# Patient Record
Sex: Female | Born: 2000 | Hispanic: Yes | Marital: Single | Smoking: Never smoker
Health system: Southern US, Community
[De-identification: ages and names within clinical notes are randomized; demographics above are authoritative.]

## PROBLEM LIST (undated history)

## (undated) DIAGNOSIS — K2970 Gastritis, unspecified, without bleeding: Secondary | ICD-10-CM

---
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
## 2022-01-12 ENCOUNTER — Emergency Department (HOSPITAL_COMMUNITY): Payer: Medicaid Other

## 2022-01-12 ENCOUNTER — Other Ambulatory Visit: Payer: Self-pay

## 2022-01-12 ENCOUNTER — Encounter (HOSPITAL_COMMUNITY): Payer: Self-pay | Admitting: Emergency Medicine

## 2022-01-12 ENCOUNTER — Emergency Department (HOSPITAL_COMMUNITY)
Admission: EM | Admit: 2022-01-12 | Discharge: 2022-01-12 | Disposition: A | Discharge status: Home / Self Care | Payer: Medicaid Other | Attending: Emergency Medicine | Admitting: Emergency Medicine

## 2022-01-12 DIAGNOSIS — R1013 Epigastric pain: Secondary | ICD-10-CM | POA: Insufficient documentation

## 2022-01-12 DIAGNOSIS — R197 Diarrhea, unspecified: Secondary | ICD-10-CM | POA: Insufficient documentation

## 2022-01-12 DIAGNOSIS — R6883 Chills (without fever): Secondary | ICD-10-CM | POA: Diagnosis not present

## 2022-01-12 LAB — COMPREHENSIVE METABOLIC PANEL
ALT: 19 U/L (ref 0–44)
AST: 16 U/L (ref 15–41)
Albumin: 3.8 g/dL (ref 3.5–5.0)
Alkaline Phosphatase: 86 U/L (ref 38–126)
Anion gap: 9 (ref 5–15)
BUN: 10 mg/dL (ref 6–20)
CO2: 24 mmol/L (ref 22–32)
Calcium: 9.2 mg/dL (ref 8.9–10.3)
Chloride: 101 mmol/L (ref 98–111)
Creatinine, Ser: 0.57 mg/dL (ref 0.44–1.00)
GFR, Estimated: >60 mL/min (ref >60)
Glucose, Bld: 118 mg/dL — ABNORMAL HIGH (ref 70–99)
Potassium: 3.6 mmol/L (ref 3.5–5.1)
Sodium: 134 mmol/L — ABNORMAL LOW (ref 135–145)
Total Bilirubin: 0.3 mg/dL (ref 0.3–1.2)
Total Protein: 7.8 g/dL (ref 6.5–8.1)

## 2022-01-12 LAB — CBC
HCT: 42.7 % (ref 36.0–46.0)
Hemoglobin: 14.4 g/dL (ref 12.0–15.0)
MCH: 30.4 pg (ref 26.0–34.0)
MCHC: 33.7 g/dL (ref 30.0–36.0)
MCV: 90.3 fL (ref 80.0–100.0)
Platelets: 302 10*3/uL (ref 150–400)
RBC: 4.73 MIL/uL (ref 3.87–5.11)
RDW: 11.6 % (ref 11.5–15.5)
WBC: 11.1 10*3/uL — ABNORMAL HIGH (ref 4.0–10.5)
nRBC: 0 % (ref 0.0–0.2)

## 2022-01-12 LAB — URINALYSIS, ROUTINE W REFLEX MICROSCOPIC
Bacteria, UA: NONE SEEN
Bilirubin Urine: NEGATIVE
Glucose, UA: NEGATIVE mg/dL
Ketones, ur: NEGATIVE mg/dL
Nitrite: NEGATIVE
Protein, ur: NEGATIVE mg/dL
Specific Gravity, Urine: 1.026 (ref 1.005–1.030)
pH: 5 (ref 5.0–8.0)

## 2022-01-12 LAB — LIPASE, BLOOD: Lipase: 23 U/L (ref 11–51)

## 2022-01-12 LAB — I-STAT BETA HCG BLOOD, ED (MC, WL, AP ONLY): I-stat hCG, quantitative: <5 m[IU]/mL (ref <5)

## 2022-01-12 MED ORDER — SUCRALFATE 1 G PO TABS: 1.0000 g | ORAL_TABLET | Freq: Three times a day (TID) | ORAL | 0 refills | Status: DC

## 2022-01-12 MED ORDER — LIDOCAINE VISCOUS HCL 2 % MT SOLN
15.0000 mL | Freq: Once | OROMUCOSAL | Status: AC
  Administered 2022-01-12: 15 mL via ORAL
  Filled 2022-01-12: qty 15

## 2022-01-12 MED ORDER — PANTOPRAZOLE SODIUM 20 MG PO TBEC: 20.0000 mg | DELAYED_RELEASE_TABLET | Freq: Every day | ORAL | 0 refills | Status: DC

## 2022-01-12 MED ORDER — ONDANSETRON HCL 4 MG/2ML IJ SOLN
4.0000 mg | Freq: Once | INTRAMUSCULAR | Status: AC
  Administered 2022-01-12: 4 mg via INTRAVENOUS
  Filled 2022-01-12: qty 2

## 2022-01-12 MED ORDER — MORPHINE SULFATE (PF) 4 MG/ML IV SOLN
4.0000 mg | Freq: Once | INTRAVENOUS | Status: AC
  Administered 2022-01-12: 4 mg via INTRAVENOUS
  Filled 2022-01-12: qty 1

## 2022-01-12 MED ORDER — ALUM & MAG HYDROXIDE-SIMETH 200-200-20 MG/5ML PO SUSP
30.0000 mL | Freq: Once | ORAL | Status: AC
  Administered 2022-01-12: 30 mL via ORAL
  Filled 2022-01-12: qty 30

## 2022-01-12 NOTE — Discharge Instructions (Signed)
Your abdominal pain is likely due to inflammations of your stomach.  Please take Protonix and Carafate as prescribed.  Follow-up with your primary care provider for further care.  Return if your symptoms worsen or if you have any concern.

## 2022-01-12 NOTE — ED Notes (Signed)
Patient verbalizes understanding of d/c instructions. Opportunities for questions and answers were provided. Pt d/c from ED and ambulated to lobby.  

## 2022-01-12 NOTE — ED Triage Notes (Signed)
C/o upper abd pain since Wednesday with nausea.  Denies vomiting and diarrhea.  States she has abnormal menstrual cycles and having vaginal bleeding x 1 month.

## 2022-01-12 NOTE — ED Provider Notes (Signed)
[facility] EMERGENCY DEPARTMENT Provider Note   CSN: [id number] Arrival date & time: 01/12/22  1825     History  Chief Complaint  Patient presents with   Abdominal Pain    Lindsay Yates is a 21 y.o. female.  The history is provided by the patient and medical records. No language interpreter was used.  Abdominal Pain  21 year old female presenting for evaluation of abdominal pain.  Patient report for the past 3 to 4 days she has had upper abdominal pain.  She described pain as a burning sensation to her mid upper abdomen, worsening with movement and with eating.  Pain is moderate and episodic.  She also endorsed feeling nauseous, and has vomited bilious contents.  She endorses having some loose stools as well and some chills.  She denies fever chest pain shortness of breath productive cough.  She denies any dysuria.  She has an intact gallbladder.  Patient also endorsed having abnormal menstruation and states she has been having intermittent vaginal bleeding for the past month.  Home Medications Prior to Admission medications   Medication Sig Start Date End Date Taking? Authorizing Provider  naproxen (NAPROSYN) 500 MG tablet Take 1 tablet (500 mg total) by mouth 2 (two) times daily. 11/07/14   Antony Madura, PA-C      Allergies    Patient has no known allergies.    Review of Systems   Review of Systems  Gastrointestinal:  Positive for abdominal pain.  All other systems reviewed and are negative.  Physical Exam Updated Vital Signs BP (!) 160/109    Pulse 76    Temp 97.7 F (36.5 C)    Resp 15    SpO2 100%  Physical Exam Vitals and nursing note reviewed.  Constitutional:      General: She is not in acute distress.    Appearance: She is well-developed. She is obese.  HENT:     Head: Atraumatic.  Eyes:     Conjunctiva/sclera: Conjunctivae normal.  Cardiovascular:     Rate and Rhythm: Normal rate and regular rhythm.  Pulmonary:     Effort:  Pulmonary effort is normal.     Breath sounds: No wheezing, rhonchi or rales.  Abdominal:     Palpations: Abdomen is soft.     Tenderness: There is abdominal tenderness in the right upper quadrant, epigastric area and periumbilical area.     Hernia: No hernia is present.  Musculoskeletal:     Cervical back: Neck supple.  Skin:    Findings: No rash.  Neurological:     Mental Status: She is alert.  Psychiatric:        Mood and Affect: Mood normal.    ED Results / Procedures / Treatments   Labs (all labs ordered are listed, but only abnormal results are displayed) Labs Reviewed  COMPREHENSIVE METABOLIC PANEL - Abnormal; Notable for the following components:      Result Value   Sodium 134 (*)    Glucose, Bld 118 (*)    All other components within normal limits  CBC - Abnormal; Notable for the following components:   WBC 11.1 (*)    All other components within normal limits  URINALYSIS, ROUTINE W REFLEX MICROSCOPIC - Abnormal; Notable for the following components:   APPearance CLOUDY (*)    Hgb urine dipstick MODERATE (*)    Leukocytes,Ua TRACE (*)    All other components within normal limits  LIPASE, BLOOD  I-STAT BETA HCG BLOOD, ED ([facility], [facility], [facility]  ONLY)    EKG None  Radiology US Abdomen Limited  Result Date: 01/12/2022 CLINICAL DATA:  RUQ pain EXAM: ULTRASOUND ABDOMEN LIMITED RIGHT UPPER QUADRANT COMPARISON:  None. FINDINGS: Gallbladder: No gallstones or wall thickening visualized. No sonographic Murphy sign noted by sonographer. Common bile duct: Diameter: 6 mm. Liver: Focal fatty sparing along the gallbladder fossa (image 6 of 30). No definite focal lesion identified. Increased parenchymal echogenicity. Portal vein is patent on color Doppler imaging with normal direction of blood flow towards the liver. Other: None. IMPRESSION: Hepatic steatosis. Please note limited evaluation for focal hepatic masses in a patient with hepatic steatosis due to decreased penetration of the  acoustic ultrasound waves. Electronically Signed   By: Tish Frederickson M.D.   On: 01/12/2022 20:06    Procedures Procedures    Medications Ordered in ED Medications  morphine (PF) 4 MG/ML injection 4 mg (4 mg Intravenous Given 01/12/22 1929)  ondansetron (ZOFRAN) injection 4 mg (4 mg Intravenous Given 01/12/22 1929)  alum & mag hydroxide-simeth (MAALOX/MYLANTA) 200-200-20 MG/5ML suspension 30 mL (30 mLs Oral Given 01/12/22 2029)    And  lidocaine (XYLOCAINE) 2 % viscous mouth solution 15 mL (15 mLs Oral Given 01/12/22 2029)    ED Course/ Medical Decision Making/ A&P                           Medical Decision Making Amount and/or Complexity of Data Reviewed Labs: ordered.   BP (!) 160/109    Pulse 76    Temp 97.7 F (36.5 C)    Resp 15    SpO2 100%   7:19 PM This is a 21 year old obese female here with upper abdominal discomfort for the past few days.  She also endorsed nausea and vomiting of bilious content.  On exam she does have tenderness to her epigastric and right upper quadrant region.  She would benefit from a limited abdominal ultrasound to rule out biliary disease.  No pain at McBurney's point, low suspicion for appendicitis.  No chest pain shortness of breath no productive cough to suggest cardiopulmonary disease.  8:54 PM Labs and imaging was independently reviewed and interpreted by me.  Pregnancy test is negative, normal lipase, normal electrolyte panels,Mild elevated white count of 11.1, UA shows cloudy urine and moderate hemoglobin to 6 but no evidence of UTI.  Patient is currently on her menstruation therefore I suspect the blood is not from the bladder or kidney.  Limited abdominal ultrasound obtain without evidence of gallstone or wall thickening of the gallbladder.  Evidence of hepatic steatosis noted.  After receiving GI cocktail, patient reports she is feeling better.  When considering her work-up, at this time I suspect her discomfort is likely gastritis/reflux and  less likely to be other acute abdominal pathology.  Will discharge home with PPI, and encourage patient to follow-up with PCP for further care.  This patient presents to the ED for concern of abd pain, this involves an extensive number of treatment options, and is a complaint that carries with it a high risk of complications and morbidity.  The differential diagnosis includes cholecystitis, pancreatitis, gastritis, PUD, viral GI  Co morbidities that complicate the patient evaluation obesity Additional history obtained:  Additional history obtained from none External records from outside source obtained and reviewed including prior ER notes  Lab Tests:  I Ordered, and personally interpreted labs.  The pertinent results include:  as above  Imaging Studies ordered:  I ordered imaging  studies including limited abdominal US I independently visualized and interpreted imaging which showed no acute finding I agree with the radiologist interpretation  Cardiac Monitoring:  The patient was maintained on a cardiac monitor.  I personally viewed and interpreted the cardiac monitored which showed an underlying rhythm of: sinus rhythm  Medicines ordered and prescription drug management:  I ordered medication including GI cocktail  for abd pain Reevaluation of the patient after these medicines showed that the patient improved I have reviewed the patients home medicines and have made adjustments as needed  Test Considered: as above  Critical Interventions: as above   Problem List / ED Course: epigastric pain  Reevaluation:  After the interventions noted above, I reevaluated the patient and found that they have :improved  Social Determinants of Health: lack of follow up  No PCP  Dispostion:  After consideration of the diagnostic results and the patients response to treatment, I feel that the patent would benefit from outpt f/u.         Final Clinical Impression(s) / ED  Diagnoses Final diagnoses:  Epigastric pain    Rx / DC Orders ED Discharge Orders          Ordered    pantoprazole (PROTONIX) 20 MG tablet  Daily        01/12/22 2059    sucralfate (CARAFATE) 1 g tablet  3 times daily with meals & bedtime        01/12/22 2059              Fayrene Helper, PA-C 01/12/22 2106    Gwyneth Sprout, MD 01/12/22 2317

## 2022-01-17 ENCOUNTER — Emergency Department (HOSPITAL_COMMUNITY)
Admission: EM | Admit: 2022-01-17 | Discharge: 2022-01-18 | Disposition: A | Discharge status: Home / Self Care | Payer: Medicaid Other | Attending: Emergency Medicine | Admitting: Emergency Medicine

## 2022-01-17 ENCOUNTER — Encounter (HOSPITAL_COMMUNITY): Payer: Self-pay

## 2022-01-17 DIAGNOSIS — R300 Dysuria: Secondary | ICD-10-CM | POA: Diagnosis not present

## 2022-01-17 DIAGNOSIS — R1013 Epigastric pain: Secondary | ICD-10-CM | POA: Insufficient documentation

## 2022-01-17 DIAGNOSIS — R112 Nausea with vomiting, unspecified: Secondary | ICD-10-CM | POA: Diagnosis not present

## 2022-01-17 DIAGNOSIS — Z5321 Procedure and treatment not carried out due to patient leaving prior to being seen by health care provider: Secondary | ICD-10-CM | POA: Insufficient documentation

## 2022-01-17 LAB — COMPREHENSIVE METABOLIC PANEL
ALT: 18 U/L (ref 0–44)
AST: 16 U/L (ref 15–41)
Albumin: 4 g/dL (ref 3.5–5.0)
Alkaline Phosphatase: 94 U/L (ref 38–126)
Anion gap: 10 (ref 5–15)
BUN: 9 mg/dL (ref 6–20)
CO2: 23 mmol/L (ref 22–32)
Calcium: 9.3 mg/dL (ref 8.9–10.3)
Chloride: 101 mmol/L (ref 98–111)
Creatinine, Ser: 0.55 mg/dL (ref 0.44–1.00)
GFR, Estimated: >60 mL/min (ref >60)
Glucose, Bld: 110 mg/dL — ABNORMAL HIGH (ref 70–99)
Potassium: 3.7 mmol/L (ref 3.5–5.1)
Sodium: 134 mmol/L — ABNORMAL LOW (ref 135–145)
Total Bilirubin: 0.3 mg/dL (ref 0.3–1.2)
Total Protein: 8.1 g/dL (ref 6.5–8.1)

## 2022-01-17 LAB — CBC
HCT: 42.8 % (ref 36.0–46.0)
Hemoglobin: 14.4 g/dL (ref 12.0–15.0)
MCH: 30.2 pg (ref 26.0–34.0)
MCHC: 33.6 g/dL (ref 30.0–36.0)
MCV: 89.7 fL (ref 80.0–100.0)
Platelets: 313 10*3/uL (ref 150–400)
RBC: 4.77 MIL/uL (ref 3.87–5.11)
RDW: 11.7 % (ref 11.5–15.5)
WBC: 12.5 10*3/uL — ABNORMAL HIGH (ref 4.0–10.5)
nRBC: 0 % (ref 0.0–0.2)

## 2022-01-17 LAB — URINALYSIS, ROUTINE W REFLEX MICROSCOPIC
Bilirubin Urine: NEGATIVE
Glucose, UA: NEGATIVE mg/dL
Ketones, ur: NEGATIVE mg/dL
Nitrite: NEGATIVE
Protein, ur: 30 mg/dL — AB
Specific Gravity, Urine: 1.027 (ref 1.005–1.030)
pH: 5 (ref 5.0–8.0)

## 2022-01-17 LAB — I-STAT BETA HCG BLOOD, ED (MC, WL, AP ONLY): I-stat hCG, quantitative: <5 m[IU]/mL (ref <5)

## 2022-01-17 LAB — LIPASE, BLOOD: Lipase: 24 U/L (ref 11–51)

## 2022-01-17 NOTE — ED Triage Notes (Signed)
Pt arrives POV for eval of epigastric pain w/ radiation to LUQ. Pt reports eating/drinking makes it worse. Seen here 2/12 for same and had neg RUQ abd U/S at that time. Reports ongoing sx since that time

## 2022-01-17 NOTE — ED Provider Triage Note (Signed)
Emergency Medicine Provider Triage Evaluation Note  Lindsay Yates , a 21 y.o. female  was evaluated in triage.  Pt complains of epigastric abdominal pain. States that same has been ongoing since 2/09. Was seen here on 2/12 and had RUQ [place] which was unremarkable for acute findings. States that her pain is worse with eating and that she is unable to eat due to nausea and vomiting and severe pain. States that pain is worse today which is what brought her in today. Also states that it has been several days since her last bowel movement. No hx of abdominal surgeries. Has appt with GI next Friday but does not feel that she can wait due to pain.  Review of Systems  Positive:  Negative: See above  Physical Exam  BP (!) 138/94 (BP Location: Right Arm)    Pulse 77    Temp 98.8 F (37.1 C) (Oral)    Resp 16    Ht 5\' 3"  (1.6 m)    Wt 100 kg    SpO2 99%    BMI 39.05 kg/m  Gen:   Awake, no distress   Resp:  Normal effort  MSK:   Moves extremities without difficulty  Other:  Diffuse abdominal tenderness  Medical Decision Making  Medically screening exam initiated at 8:22 PM.  Appropriate orders placed.  Lindsay Yates was informed that the remainder of the evaluation will be completed by another provider, this initial triage assessment does not replace that evaluation, and the importance of remaining in the ED until their evaluation is complete.     Yong Channel, PA-C 01/17/22 2025

## 2022-01-18 ENCOUNTER — Emergency Department (HOSPITAL_COMMUNITY): Payer: Medicaid Other

## 2022-01-18 ENCOUNTER — Emergency Department (HOSPITAL_COMMUNITY)
Admission: EM | Admit: 2022-01-18 | Discharge: 2022-01-18 | Disposition: A | Discharge status: Home / Self Care | Payer: Medicaid Other | Source: Home / Self Care | Attending: Emergency Medicine | Admitting: Emergency Medicine

## 2022-01-18 ENCOUNTER — Other Ambulatory Visit: Payer: Self-pay

## 2022-01-18 ENCOUNTER — Encounter (HOSPITAL_COMMUNITY): Payer: Self-pay | Admitting: Emergency Medicine

## 2022-01-18 DIAGNOSIS — R1013 Epigastric pain: Secondary | ICD-10-CM | POA: Insufficient documentation

## 2022-01-18 DIAGNOSIS — R300 Dysuria: Secondary | ICD-10-CM | POA: Insufficient documentation

## 2022-01-18 DIAGNOSIS — R110 Nausea: Secondary | ICD-10-CM | POA: Insufficient documentation

## 2022-01-18 DIAGNOSIS — R109 Unspecified abdominal pain: Secondary | ICD-10-CM

## 2022-01-18 MED ORDER — POLYETHYLENE GLYCOL 3350 17 G PO PACK: 17.0000 g | PACK | Freq: Every day | ORAL | 0 refills | Status: DC

## 2022-01-18 MED ORDER — DICYCLOMINE HCL 20 MG PO TABS: 20.0000 mg | ORAL_TABLET | Freq: Two times a day (BID) | ORAL | 0 refills | Status: DC

## 2022-01-18 MED ORDER — IOHEXOL 300 MG/ML  SOLN
100.0000 mL | Freq: Once | INTRAMUSCULAR | Status: AC | PRN
  Administered 2022-01-18: 100 mL via INTRAVENOUS

## 2022-01-18 MED ORDER — ONDANSETRON 4 MG PO TBDP: 4.0000 mg | ORAL_TABLET | Freq: Three times a day (TID) | ORAL | 0 refills | Status: DC | PRN

## 2022-01-18 MED ORDER — CEPHALEXIN 500 MG PO CAPS: 500.0000 mg | ORAL_CAPSULE | Freq: Three times a day (TID) | ORAL | 0 refills | Status: AC

## 2022-01-18 MED ORDER — SODIUM CHLORIDE 0.9 % IV BOLUS
1000.0000 mL | Freq: Once | INTRAVENOUS | Status: AC
  Administered 2022-01-18: 1000 mL via INTRAVENOUS

## 2022-01-18 MED ORDER — SUCRALFATE 1 G PO TABS: 1.0000 g | ORAL_TABLET | Freq: Three times a day (TID) | ORAL | 0 refills | Status: DC

## 2022-01-18 MED ORDER — SENNA 8.6 MG PO TABS: 1.0000 | ORAL_TABLET | Freq: Two times a day (BID) | ORAL | 0 refills | Status: DC | PRN

## 2022-01-18 NOTE — ED Triage Notes (Signed)
Pt reports upper abd pain since Wednesday that is worse after eating and drinking water.  States she came to ED last night and had labs and CT and that she has been sitting in lobby since then and didn't hear her name called.  Denies any pain at present.  Reports nausea, vomiting, and constipation.  Saline lock in place from previous visit.

## 2022-01-18 NOTE — ED Provider Notes (Signed)
[facility] EMERGENC[facility] DEPARTMENT Provider Note   CSN: [id number] Arrival date & time: 01/18/22  0831     History  Chief Complaint  Patient presents with   Abdominal Pain    Lindsay Yates is a 21 y.o. female.  21 year old female without significant past medical history presents today for intermittent epigastric abdominal pain of 1 week duration.  Patient was evaluated here 2/12 and was given Protonix and Carafate with improvement in symptoms.  Patient states her pain returned on Wednesday.  Patient states her pain primarily comes on after eating and she describes it as burning and stabbing pain.  She denies fever, chills, vomiting but does endorse nausea, radiation of pain.  She does endorse dysuria over the past couple days, she attributes this to lack of hydration.  She does have GI appointment on Friday.  She states the medication she was given last week do help with her symptoms.  She reports last bowel movement was Thursday.  This is not typical for her.  She has not tried any over-the-counter laxatives.  The history is provided by the patient. No language interpreter was used.      Home Medications Prior to Admission medications   Medication Sig Start Date End Date Taking? Authorizing Provider  naproxen (NAPROSYN) 500 MG tablet Take 1 tablet (500 mg total) by mouth 2 (two) times daily. 11/07/14   Antonietta Breach, PA-C  pantoprazole (PROTONIX) 20 MG tablet Take 1 tablet (20 mg total) by mouth daily. 01/12/22   Domenic Moras, PA-C  sucralfate (CARAFATE) 1 g tablet Take 1 tablet (1 g total) by mouth 4 (four) times daily -  with meals and at bedtime. 01/12/22   Domenic Moras, PA-C      Allergies    Patient has no known allergies.    Review of Systems   Review of Systems  Constitutional:  Negative for activity change, chills and fever.  Respiratory:  Negative for shortness of breath.   Gastrointestinal:  Positive for abdominal pain, constipation and nausea.  Negative for blood in stool and vomiting.  Genitourinary:  Positive for dysuria. Negative for flank pain.  Musculoskeletal:  Negative for back pain.  All other systems reviewed and are negative.  Physical Exam Updated Vital Signs BP (!) 140/100 (BP Location: Right Arm)    Pulse (!) 101    Temp 98.4 F (36.9 C) (Oral)    Resp 16    SpO2 99%  Physical Exam Vitals and nursing note reviewed.  Constitutional:      General: She is not in acute distress.    Appearance: Normal appearance. She is not ill-appearing.  HENT:     Head: Normocephalic and atraumatic.     Nose: Nose normal.  Eyes:     General: No scleral icterus.    Extraocular Movements: Extraocular movements intact.     Conjunctiva/sclera: Conjunctivae normal.  Cardiovascular:     Rate and Rhythm: Normal rate and regular rhythm.     Pulses: Normal pulses.     Heart sounds: Normal heart sounds.  Pulmonary:     Effort: Pulmonary effort is normal. No respiratory distress.     Breath sounds: Normal breath sounds. No wheezing or rales.  Abdominal:     General: There is no distension.     Palpations: Abdomen is soft.     Tenderness: There is no abdominal tenderness. There is no right CVA tenderness, left CVA tenderness or guarding.  Musculoskeletal:        General:  Normal range of motion.     Cervical back: Normal range of motion.  Skin:    General: Skin is warm and dry.  Neurological:     General: No focal deficit present.     Mental Status: She is alert. Mental status is at baseline.    ED Results / Procedures / Treatments   Labs (all labs ordered are listed, but only abnormal results are displayed) Labs Reviewed - No data to display  EKG None  Radiology CT ABDOMEN PELVIS W CONTRAST  Result Date: 01/18/2022 CLINICAL DATA:  Abdominal pain, significantly worse with eating or drinking. EXAM: CT ABDOMEN AND PELVIS WITH CONTRAST TECHNIQUE: Multidetector CT imaging of the abdomen and pelvis was performed using the  standard protocol following bolus administration of intravenous contrast. RADIATION DOSE REDUCTION: This exam was performed according to the departmental dose-optimization program which includes automated exposure control, adjustment of the mA and/or kV according to patient size and/or use of iterative reconstruction technique. CONTRAST:  139mL OMNIPAQUE IOHEXOL 300 MG/ML  SOLN COMPARISON:  None. FINDINGS: Lower chest: No acute abnormality. Hepatobiliary: No focal liver abnormality is seen. No gallstones, gallbladder wall thickening, or biliary dilatation. Pancreas: Unremarkable. No pancreatic ductal dilatation or surrounding inflammatory changes. Spleen: Normal in size without focal abnormality. Adrenals/Urinary Tract: Normal adrenal glands. No kidney mass or hydronephrosis identified. Urinary bladder appears normal. Stomach/Bowel: Stomach appears nondistended. The appendix is visualized and is within normal limits. No bowel wall thickening, inflammation, or distension. Vascular/Lymphatic: Normal abdominal aorta. No abdominopelvic adenopathy identified. Reproductive: Uterus and adnexal structures appear within normal limits. Other: No free fluid or fluid collections. Musculoskeletal: No acute or significant osseous findings. IMPRESSION: No acute findings within the abdomen or pelvis. Electronically Signed   By: Kerby Moors M.D.   On: 01/18/2022 06:12    Procedures Procedures    Medications Ordered in ED Medications  sodium chloride 0.9 % bolus 1,000 mL (1,000 mLs Intravenous New Bag/Given 01/18/22 [id number])    ED Course/ Medical Decision Making/ A&P                           Medical Decision Making  Medical Decision Making / ED Course   This patient presents to the ED for concern of abdominal pain, nausea, this involves an extensive number of treatment options, and is a complaint that carries with it a high risk of complications and morbidity.  The differential diagnosis includes UTI, appendicitis,  cholecystitis, pancreatitis, gastroenteritis, mesenteric ischemia  MDM: 21 year old female presents today for evaluation of abdominal pain, nausea of about 1 week duration.  Pain is intermittent and primarily postprandial.  Currently she is asymptomatic.  Abdomen is nondistended and nontender.  GI follow-up scheduled for Friday.  Without significant past medical history.  Patient presented last night and states she has been here since then however her name was not called.  She did have blood work as well as CT imaging done under that visit.  CBC with mild leukocytosis which is likely reactive given she is without fever.  Alternatively this could be associated with potential UTI.  UA shows some leukocytes however nitrite negative.  We will treat for UTI and send urine culture.  CMP with sodium of 134, glucose of 110 otherwise unremarkable.  Lipase within normal limits.  Unlikely mesenteric ischemia given no risk factors, during the episodes of pain her pain is localized as opposed to being diffuse which would be seen in mesenteric ischemia.  Description not  consistent with mesenteric ischemia.  Mild leukocytosis.  Would expect higher leukocytosis and mesenteric ischemia.  Nontoxic-appearing.  Doubt appendicitis or cholecystitis given location of pain and negative CT abdomen pelvis.  Nature of pain not consistent with pericarditis.  Will provide IV hydration and reassess.  Patient remains without symptoms despite p.o. intake in the emergency room.  Patient is appropriate for discharge.  Bowel regimen discussed.  Will provide Bentyl and refill on Carafate.  Will provide Zofran.  Discussed importance of follow-up with gastroenterology.  We will treat potential UTI and send for urine culture.   Additional history obtained: -Additional history obtained from recent emergency room visit on 2/12 confirming patient was given Protonix and Carafate. -External records from outside source obtained and reviewed  including: Chart review including previous notes, labs, imaging, consultation notes   Lab Tests: -I ordered, reviewed, and interpreted labs.   The pertinent results include:   Labs Reviewed - No data to display    EKG  EKG Interpretation  Date/Time:    Ventricular Rate:    PR Interval:    QRS Duration:   QT Interval:    QTC Calculation:   R Axis:     Text Interpretation:           Imaging Studies ordered: I ordered imaging studies including CT abdomen pelvis without acute intra-abdominal process I independently visualized and interpreted imaging. I agree with the radiologist interpretation   Medicines ordered and prescription drug management: Meds ordered this encounter  Medications   sodium chloride 0.9 % bolus 1,000 mL    -I have reviewed the patients home medicines and have made adjustments as needed  Reevaluation: After the interventions noted above, I reevaluated the patient and found that they have :improved Initially noted to be tachycardic however rate improved prior to discharge following hydration.  Co morbidities that complicate the patient evaluation History reviewed. No pertinent past medical history.    Dispostion: Patient is appropriate for discharge.  Discharged in stable condition.  Return precautions discussed.  Patient voices understanding and is in agreement with plan.   Final Clinical Impression(s) / ED Diagnoses Final diagnoses:  Abdominal pain, unspecified abdominal location    Rx / DC Orders ED Discharge Orders          Ordered    sucralfate (CARAFATE) 1 g tablet  3 times daily with meals & bedtime        01/18/22 0958    dicyclomine (BENTYL) 20 MG tablet  2 times daily        01/18/22 0958    cephALEXin (KEFLEX) 500 MG capsule  3 times daily        01/18/22 0958    polyethylene glycol (MIRALAX) 17 g packet  Daily        01/18/22 0958    senna (SENOKOT) 8.6 MG TABS tablet  2 times daily PRN        01/18/22 0958     ondansetron (ZOFRAN-ODT) 4 MG disintegrating tablet  Every 8 hours PRN        01/18/22 1036              Evlyn Courier, PA-C 01/18/22 1036    Davonna Belling, MD 01/18/22 1528

## 2022-01-18 NOTE — Discharge Instructions (Addendum)
Your work-up in the emergency room including blood work, CT scan were reassuring against a serious cause of your abdominal pain.  You are given some IV fluids for hydration.  Your UA showed potential urinary tract infection.  I have sent in antibiotics to the pharmacy for you.  We will also send in a urine culture to confirm if you have a urinary tract infection.  Also recommend you take MiraLAX and senna to regulate your bowels.  You can take up to 4 scoops of MiraLAX per day until you have a bowel movement and then you can start decreasing the dosage down to 1 scoop per day.  Please follow-up with your gastroenterologist as scheduled.  If your abdominal pain worsens, you develop fever, nausea vomiting to the point you are unable to keep any food or drink down please return to the emergency room.  I have sent and nausea medication, Bentyl, and refill of your Carafate.

## 2022-01-18 NOTE — ED Notes (Signed)
Pt not found during vitals rounding. Pt called for vitals x3 no answer.

## 2022-01-18 NOTE — ED Notes (Signed)
Graham crackers and water given for po challenge.

## 2022-01-18 NOTE — ED Provider Triage Note (Signed)
Emergency Medicine Provider Triage Evaluation Note  Lindsay Yates , a 21 y.o. female  was evaluated in triage.  Pt complains of abdominal pain.  Patient was seen in triage last night and states she never left the department. Nursing staff notes report patient was called for vital signs x 3 at 5:45am this morning and they were unable to find the patient. She was removed from the system at that time.   She states since she hasn't had anything to eat or drink in hours so her pain has gone away. She said it is only painful after eating or drinking.   Previous provider's triage MSE note: "Lindsay Yates , a 21 y.o. female  was evaluated in triage.  Pt complains of epigastric abdominal pain. States that same has been ongoing since 2/09. Was seen here on 2/12 and had RUQ [place] which was unremarkable for acute findings. States that her pain is worse with eating and that she is unable to eat due to nausea and vomiting and severe pain. States that pain is worse today which is what brought her in today. Also states that it has been several days since her last bowel movement. No hx of abdominal surgeries. Has appt with GI next Friday but does not feel that she can wait due to pain."  Review of Systems  As above  Physical Exam  BP (!) 140/100 (BP Location: Right Arm)    Pulse (!) 101    Temp 98.4 F (36.9 C) (Oral)    Resp 16    SpO2 99%  Gen:   Awake, no distress   Resp:  Normal effort  MSK:   Moves extremities without difficulty  Other:    Medical Decision Making  Medically screening exam initiated at 8:40 AM.  Appropriate orders placed.  Lindsay Yates was informed that the remainder of the evaluation will be completed by another provider, this initial triage assessment does not replace that evaluation, and the importance of remaining in the ED until their evaluation is complete.     Lindsay Yates T, PA-C 01/18/22 0840

## 2022-01-24 ENCOUNTER — Ambulatory Visit (INDEPENDENT_AMBULATORY_CARE_PROVIDER_SITE_OTHER): Payer: Medicaid Other | Admitting: Primary Care

## 2022-01-24 ENCOUNTER — Encounter (INDEPENDENT_AMBULATORY_CARE_PROVIDER_SITE_OTHER): Payer: Self-pay | Admitting: Primary Care

## 2022-01-24 ENCOUNTER — Other Ambulatory Visit: Payer: Self-pay

## 2022-01-24 VITALS — BP 115/79 | HR 96 | Temp 97.8°F | Ht 63.0 in | Wt 237.4 lb

## 2022-01-24 DIAGNOSIS — K219 Gastro-esophageal reflux disease without esophagitis: Secondary | ICD-10-CM

## 2022-01-24 DIAGNOSIS — Z7689 Persons encountering health services in other specified circumstances: Secondary | ICD-10-CM | POA: Diagnosis not present

## 2022-01-24 MED ORDER — SUCRALFATE 1 G PO TABS: 1.0000 g | ORAL_TABLET | Freq: Three times a day (TID) | ORAL | 1 refills | Status: DC

## 2022-01-24 NOTE — Patient Instructions (Signed)

## 2022-01-24 NOTE — Progress Notes (Signed)
Abdominal pain only when applying pressure or stretching

## 2022-01-24 NOTE — Progress Notes (Signed)
[facility]   Subjective:   [name] is a 21 y.o. female presents for ED  follow up and establish care. Patient presented to the ED on  01/18/22, she was experiencing n/v all day before her birthday. She was dx with abdominal pain, unspecified abdominal location. 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 She tx herself with OTC EXCEDRIN .  Triggers can be caused by drinking alcohol , caffeine , coffee and 3 energy drinks.  This condition may be triggered or caused by: Caffeine, aged cheese, and chocolate. Irregular cycle due to [facility]- Nexplanon  Lindsay Yates sister was given permission to be presen t at her appt.  History reviewed. No pertinent past medical history.   No Known Allergies    Current Outpatient Medications on File Prior to Visit  Medication Sig Dispense Refill   dicyclomine (BENTYL) 20 MG tablet Take 1 tablet (20 mg total) by mouth 2 (two) times daily. 20 tablet 0   ondansetron (ZOFRAN-ODT) 4 MG disintegrating tablet Take 1 tablet (4 mg total) by mouth every 8 (eight) hours as needed for nausea or vomiting. 20 tablet 0   pantoprazole (PROTONIX) 20 MG tablet Take 1 tablet (20 mg total) by mouth daily. 30 tablet 0   polyethylene glycol (MIRALAX) 17 g packet Take 17 g by mouth daily. 14 each 0   senna (SENOKOT) 8.6 MG TABS tablet Take 1 tablet (8.6 mg total) by mouth 2 (two) times daily as needed for mild constipation. 30 tablet 0   sucralfate (CARAFATE) 1 g tablet Take 1 tablet (1 g total) by mouth 4 (four) times daily -  with meals and at bedtime. 20 tablet 0   No current facility-administered medications on file prior to visit.     Review of System: Comprehensive ROS Pertinent positive and negative noted in HPI    Objective:  BP 115/79 (BP Location: Right Arm, Patient Position: Sitting, Cuff Size: Normal)    Pulse 96    Temp 97.8 F (36.6 C) (Oral)    Ht 5\' 3"  (1.6 m)    Wt 237 lb 6.4 oz (107.7 kg)    LMP  11/21/2021 (Approximate)    SpO2 96%    BMI 42.05 kg/m   Filed Weights   01/24/22 0940  Weight: 237 lb 6.4 oz (107.7 kg)    Physical Exam: General Appearance: Well nourished, in no apparent distress. Eyes: PERRLA, EOMs, conjunctiva no swelling or erythema Sinuses: No Frontal/maxillary tenderness ENT/Mouth: Ext aud canals clear, TMs without erythema, bulging.  Hearing normal.  Neck: Supple, thyroid normal.  Respiratory: Respiratory effort normal, BS equal bilaterally without rales, rhonchi, wheezing or stridor.  Cardio: RRR with no MRGs. Brisk peripheral pulses without edema.  Abdomen: Soft, + BS.  Non tender, no guarding, rebound, hernias, masses. Lymphatics: Non tender without lymphadenopathy.  Musculoskeletal: Full ROM, 5/5 strength, normal gait.  Skin: Warm, dry without rashes, lesions, ecchymosis.  Neuro: Cranial nerves intact. Normal muscle tone, no cerebellar symptoms. Sensation intact.  Psych: Awake and oriented X 3, normal affect, Insight and Judgment appropriate.    Assessment:  [name] was seen today for hospitalization follow-up.  Diagnoses and all orders for this visit:  Gastroesophageal reflux disease without esophagitis Discussed eating small frequent meal, reduction in acidic foods, fried foods ,spicy foods, alcohol caffeine and tobacco and certain medications. Avoid laying down after eating 41mins-1hour, elevated head of the bed.  -     sucralfate (CARAFATE) 1 g tablet;  Take 1 tablet (1 g total) by mouth 4 (four) times daily -  with meals and at bedtime.   Encounter to establish care Establish care with PCP  Meds ordered this encounter  Medications   sucralfate (CARAFATE) 1 g tablet    Sig: Take 1 tablet (1 g total) by mouth 4 (four) times daily -  with meals and at bedtime.    Dispense:  120 tablet    Refill:  1    This note has been created with Education officer, environmental. Any transcriptional errors are unintentional.    Grayce Sessions, NP 02/02/2022, 8:50 PM

## 2022-05-12 ENCOUNTER — Telehealth: Payer: Medicaid Other | Admitting: Physician Assistant

## 2022-05-12 DIAGNOSIS — Z8719 Personal history of other diseases of the digestive system: Secondary | ICD-10-CM

## 2022-05-12 DIAGNOSIS — R112 Nausea with vomiting, unspecified: Secondary | ICD-10-CM

## 2022-05-12 DIAGNOSIS — R197 Diarrhea, unspecified: Secondary | ICD-10-CM

## 2022-05-12 NOTE — Patient Instructions (Signed)
Lindsay Yates, thank you for joining Margaretann Loveless, PA-C for today's virtual visit.  While this provider is not your primary care provider (PCP), if your PCP is located in our provider database this encounter information will be shared with them immediately following your visit.  Consent: (Patient) Lindsay Yates provided verbal consent for this virtual visit at the beginning of the encounter.  Current Medications:  Current Outpatient Medications:    dicyclomine (BENTYL) 20 MG tablet, Take 1 tablet (20 mg total) by mouth 2 (two) times daily., Disp: 20 tablet, Rfl: 0   ondansetron (ZOFRAN-ODT) 4 MG disintegrating tablet, Take 1 tablet (4 mg total) by mouth every 8 (eight) hours as needed for nausea or vomiting., Disp: 20 tablet, Rfl: 0   pantoprazole (PROTONIX) 20 MG tablet, Take 1 tablet (20 mg total) by mouth daily., Disp: 30 tablet, Rfl: 0   polyethylene glycol (MIRALAX) 17 g packet, Take 17 g by mouth daily., Disp: 14 each, Rfl: 0   senna (SENOKOT) 8.6 MG TABS tablet, Take 1 tablet (8.6 mg total) by mouth 2 (two) times daily as needed for mild constipation., Disp: 30 tablet, Rfl: 0   sucralfate (CARAFATE) 1 g tablet, Take 1 tablet (1 g total) by mouth 4 (four) times daily -  with meals and at bedtime., Disp: 120 tablet, Rfl: 1   Medications ordered in this encounter:  No orders of the defined types were placed in this encounter.    *If you need refills on other medications prior to your next appointment, please contact your pharmacy*  Follow-Up: Call back or seek an in-person evaluation if the symptoms worsen or if the condition fails to improve as anticipated.  Other Instructions Based on what you shared with me, I feel your condition warrants further evaluation as soon as possible at an Emergency department.   If you are having a true medical emergency please call 911.      Emergency Department-[facility] ([facility])  Get Driving Directions   [phone]  [address]  [place], [place] [address]  Open 24/7/365      [facility] Emergency Department at [facility]  Get Driving Directions  [address]  [place], [place] [address]  Open 24/7/365    Emergency Department- [facility]  Get Driving Directions  [phone]  [address]  [place], [place] [address]  Open 24/7/365      [facility] at [facility]  Get Driving Directions  [phone]  [address]  [place], [place] [address]  Open 24/7/365    [facility]  Emergency Department- [facility]  Get Driving Directions  [phone]  [address]  [place], [place] [address]  Open 24/7/365    [place]  Emergency Department- [facility] [place]  Get Driving Directions  [address]  [place], [place] [address]  Open 24/7/365    [facility]  Emergency Department- [facility]  Get Driving Directions  [phone]  [address]  [place], [place] [address]  Open 24/7/365      If you have been instructed to have an in-person evaluation today at a local Urgent Care facility, please use the link below. It will take you to a list of all of our available [facility], including address, phone number and hours of operation. Please do not delay care.   Urgent Cares  If you or a family member do not have a  primary care provider, use the link below to schedule a visit and establish care. When you choose a [facility] primary care physician or advanced practice provider, you gain a long-term partner in health. Find a Primary Care Provider  Learn more about [facility] in-office and virtual care options: [facility] - Get Care Now

## 2022-05-12 NOTE — Progress Notes (Signed)
Virtual Visit Consent   [name], you are scheduled for a virtual visit with a [facility] provider today. Just as with appointments in the office, your consent must be obtained to participate. Your consent will be active for this visit and any virtual visit you may have with one of our providers in the next 365 days. If you have a MyChart account, a copy of this consent can be sent to you electronically.  As this is a virtual visit, video technology does not allow for your provider to perform a traditional examination. This may limit your provider's ability to fully assess your condition. If your provider identifies any concerns that need to be evaluated in person or the need to arrange testing (such as labs, EKG, etc.), we will make arrangements to do so. Although advances in technology are sophisticated, we cannot ensure that it will always work on either your end or our end. If the connection with a video visit is poor, the visit may have to be switched to a telephone visit. With either a video or telephone visit, we are not always able to ensure that we have a secure connection.  By engaging in this virtual visit, you consent to the provision of healthcare and authorize for your insurance to be billed (if applicable) for the services provided during this visit. Depending on your insurance coverage, you may receive a charge related to this service.  I need to obtain your verbal consent now. Are you willing to proceed with your visit today? Lindsay Yates has provided verbal consent on 05/12/2022 for a virtual visit (video or telephone). Margaretann Loveless, PA-C  Date: 05/12/2022 2:14 PM  Virtual Visit via Video Note   I, Margaretann Loveless, connected with  Lindsay Yates  ([id number], 10-May-2001) on 05/12/22 at  2:00 PM EDT by a video-enabled telemedicine application and verified that I am speaking with the correct person using two identifiers.  Location: Patient:  Virtual Visit Location Patient: Home Provider: Virtual Visit Location Provider: Home Office   I discussed the limitations of evaluation and management by telemedicine and the availability of in person appointments. The patient expressed understanding and agreed to proceed.    History of Present Illness: Lindsay Yates is a 21 y.o. who identifies as a female who was assigned female at birth, and is being seen today for nausea, vomiting, and diarrhea.  HPI: Emesis  This is a new problem. The current episode started yesterday. The problem occurs 5 to 10 times per day (with any intake, including fluids). The problem has been gradually worsening. The emesis has an appearance of bile. There has been no fever. Associated symptoms include abdominal pain, chills, diarrhea (constant diarrhea), headaches and myalgias. Pertinent negatives include no fever or URI. She has tried bed rest and increased fluids for the symptoms. The treatment provided no relief.      Problems: There are no problems to display for this patient.   Allergies: No Known Allergies Medications:  Current Outpatient Medications:    dicyclomine (BENTYL) 20 MG tablet, Take 1 tablet (20 mg total) by mouth 2 (two) times daily., Disp: 20 tablet, Rfl: 0   ondansetron (ZOFRAN-ODT) 4 MG disintegrating tablet, Take 1 tablet (4 mg total) by mouth every 8 (eight) hours as needed for nausea or vomiting., Disp: 20 tablet, Rfl: 0   pantoprazole (PROTONIX) 20 MG tablet, Take 1 tablet (20 mg total) by mouth daily., Disp: 30 tablet, Rfl: 0   polyethylene glycol (MIRALAX) 17 g packet,  Take 17 g by mouth daily., Disp: 14 each, Rfl: 0   senna (SENOKOT) 8.6 MG TABS tablet, Take 1 tablet (8.6 mg total) by mouth 2 (two) times daily as needed for mild constipation., Disp: 30 tablet, Rfl: 0   sucralfate (CARAFATE) 1 g tablet, Take 1 tablet (1 g total) by mouth 4 (four) times daily -  with meals and at bedtime., Disp: 120 tablet, Rfl:  1  Observations/Objective: Patient is well-developed, well-nourished in no acute distress.  Resting comfortably at home.  Head is normocephalic, atraumatic.  No labored breathing.  Speech is clear and coherent with logical content.  Patient is alert and oriented at baseline.    Assessment and Plan: 1. Nausea and vomiting, unspecified vomiting type  2. Diarrhea, unspecified type  3. History of gastritis  - Being that patient is having significant diarrhea with unable to keep any foods or liquids down I have advised her to seek in person evaluation at a local ER for further evaluation and treatment  Follow Up Instructions: I discussed the assessment and treatment plan with the patient. The patient was provided an opportunity to ask questions and all were answered. The patient agreed with the plan and demonstrated an understanding of the instructions.  A copy of instructions were sent to the patient via MyChart unless otherwise noted below.    The patient was advised to call back or seek an in-person evaluation if the symptoms worsen or if the condition fails to improve as anticipated.  Time:  I spent 10 minutes with the patient via telehealth technology discussing the above problems/concerns.    Margaretann Loveless, PA-C

## 2022-05-14 ENCOUNTER — Ambulatory Visit (HOSPITAL_COMMUNITY)
Admission: EM | Admit: 2022-05-14 | Discharge: 2022-05-14 | Disposition: A | Discharge status: Home / Self Care | Payer: Medicaid Other | Attending: Family Medicine | Admitting: Family Medicine

## 2022-05-14 ENCOUNTER — Encounter (HOSPITAL_COMMUNITY): Payer: Self-pay

## 2022-05-14 DIAGNOSIS — R1013 Epigastric pain: Secondary | ICD-10-CM

## 2022-05-14 DIAGNOSIS — K30 Functional dyspepsia: Secondary | ICD-10-CM | POA: Diagnosis not present

## 2022-05-14 HISTORY — DX: Gastritis, unspecified, without bleeding: K29.70

## 2022-05-14 LAB — POCT URINALYSIS DIPSTICK, ED / UC
Bilirubin Urine: NEGATIVE
Glucose, UA: NEGATIVE mg/dL
Hgb urine dipstick: NEGATIVE
Ketones, ur: NEGATIVE mg/dL
Leukocytes,Ua: NEGATIVE
Nitrite: NEGATIVE
Protein, ur: NEGATIVE mg/dL
Specific Gravity, Urine: >=1.03 (ref 1.005–1.030)
Urobilinogen, UA: 0.2 mg/dL (ref 0.0–1.0)
pH: 5.5 (ref 5.0–8.0)

## 2022-05-14 LAB — POC URINE PREG, ED: Preg Test, Ur: NEGATIVE

## 2022-05-14 MED ORDER — PANTOPRAZOLE SODIUM 20 MG PO TBEC: 20.0000 mg | DELAYED_RELEASE_TABLET | Freq: Every day | ORAL | 2 refills | Status: DC

## 2022-05-14 NOTE — ED Triage Notes (Signed)
Patient has history of gastritis in February. Symptoms returned Sunday night.   Patient having diarrhea and vomiting for two days onset Sunday.

## 2022-05-17 NOTE — ED Provider Notes (Signed)
[facility]   [id number] 05/14/22 Arrival Time: 1230  ASSESSMENT & PLAN:  1. Dyspepsia   With exacerbation. Benign abdominal exam. No indications for urgent abdominal/pelvic imaging at this time. Discussed GERD and management.  Begin: Meds ordered this encounter  Medications   pantoprazole (PROTONIX) 20 MG tablet    Sig: Take 1 tablet (20 mg total) by mouth daily.    Dispense:  30 tablet    Refill:  2     Follow-up Information     Schedule an appointment as soon as possible for a visit  with [facility].   Specialty: Family Medicine Contact information: [address] [place] [address] [phone]        [facility] EMERGENCY DEPARTMENT.   Specialty: Emergency Medicine Why: If symptoms worsen in any way. Contact information: [address] [id number] mc [place] [address] [phone]                Reviewed expectations re: course of current medical issues. Questions answered. Outlined signs and symptoms indicating need for more acute intervention. Patient verbalized understanding. After Visit Summary given.   SUBJECTIVE: History from: patient. Lindsay Yates is a 21 y.o. female who presents with complaint of "gastritis"; dx in past without freq exacerbations. Current exacerbation x few days. Afebrile. Occas nausea; belching more frequently. With frequent post-prandial pain. Does occas wake at night. OTC acid med helps. Denies heavy alcohol use. No recreational drug use.  No LMP recorded (lmp unknown). Patient has had an implant.    OBJECTIVE:  Vitals:   05/14/22 1304 05/14/22 1307  BP: 116/80   Pulse: 84   Resp: 16   Temp: 98.3 F (36.8 C)   TempSrc: Oral   SpO2: 96%   Weight:  107.5 kg  Height:  5\' 3"  (1.6 m)    General appearance: alert, oriented, no acute distress HEENT: [place]; AT; oropharynx moist Lungs: unlabored  respirations Abdomen: soft; without distention; no specific tenderness to palpation; normal bowel sounds; without masses or organomegaly; without guarding or rebound tenderness Back: without reported CVA tenderness; FROM at waist Extremities: without LE edema; symmetrical; without gross deformities Skin: warm and dry Neurologic: normal gait Psychological: alert and cooperative; normal mood and affect  Labs: Results for orders placed or performed during the hospital encounter of 05/14/22  POC Urinalysis dipstick  Result Value Ref Range   Glucose, UA NEGATIVE NEGATIVE mg/dL   Bilirubin Urine NEGATIVE NEGATIVE   Ketones, ur NEGATIVE NEGATIVE mg/dL   Specific Gravity, Urine >=1.030 1.005 - 1.030   Hgb urine dipstick NEGATIVE NEGATIVE   pH 5.5 5.0 - 8.0   Protein, ur NEGATIVE NEGATIVE mg/dL   Urobilinogen, UA 0.2 0.0 - 1.0 mg/dL   Nitrite NEGATIVE NEGATIVE   Leukocytes,Ua NEGATIVE NEGATIVE  POC urine pregnancy  Result Value Ref Range   Preg Test, Ur NEGATIVE NEGATIVE   Labs Reviewed  POCT URINALYSIS DIPSTICK, ED / UC  POC URINE PREG, ED    No Known Allergies                                             Past Medical History:  Diagnosis Date   Gastritis     Social History   Socioeconomic History   Marital status: Single    Spouse name: Not on file   Number of  children: Not on file   Years of education: Not on file   Highest education level: Not on file  Occupational History   Not on file  Tobacco Use   Smoking status: Never   Smokeless tobacco: Never  Substance and Sexual Activity   Alcohol use: Not Currently   Drug use: Not Currently   Sexual activity: Yes    Birth control/protection: Implant  Other Topics Concern   Not on file  Social History Narrative   Not on file   Social Determinants of Health   Financial Resource Strain: Not on file  Food Insecurity: Not on file  Transportation Needs: Not on file  Physical Activity: Not on file  Stress: Not on file   Social Connections: Not on file  Intimate Partner Violence: Not on file    History reviewed. No pertinent family history.   Mardella Layman, MD 05/17/22 [phone]

## 2022-05-21 ENCOUNTER — Ambulatory Visit (INDEPENDENT_AMBULATORY_CARE_PROVIDER_SITE_OTHER): Payer: Medicaid Other | Admitting: Primary Care

## 2022-07-24 ENCOUNTER — Telehealth (INDEPENDENT_AMBULATORY_CARE_PROVIDER_SITE_OTHER): Payer: Self-pay | Admitting: Primary Care

## 2022-07-24 ENCOUNTER — Encounter (INDEPENDENT_AMBULATORY_CARE_PROVIDER_SITE_OTHER): Payer: Self-pay | Admitting: Primary Care

## 2022-07-24 ENCOUNTER — Ambulatory Visit (INDEPENDENT_AMBULATORY_CARE_PROVIDER_SITE_OTHER): Payer: Medicaid Other | Admitting: Primary Care

## 2022-07-24 DIAGNOSIS — Z13220 Encounter for screening for lipoid disorders: Secondary | ICD-10-CM

## 2022-07-24 DIAGNOSIS — Z6841 Body Mass Index (BMI) 40.0 and over, adult: Secondary | ICD-10-CM

## 2022-07-24 DIAGNOSIS — R4581 Low self-esteem: Secondary | ICD-10-CM

## 2022-07-24 DIAGNOSIS — Z79899 Other long term (current) drug therapy: Secondary | ICD-10-CM

## 2022-07-24 MED ORDER — PHENTERMINE HCL 37.5 MG PO CAPS: ORAL_CAPSULE | ORAL | 1 refills | Status: DC

## 2022-07-24 NOTE — Telephone Encounter (Signed)
Please f/u with self esteem weight journey

## 2022-07-24 NOTE — Progress Notes (Signed)
[facility]  Lindsay Yates, is a 21 y.o. female  ZTI:[id number]  ASN:[id number]  DOB - 02-04-2001  Chief Complaint  Patient presents with   weight   Migraine    Doing better only about 1 per month        Subjective:   [name] is a 21 y.o. is a morbid obese female here today for a follow up visit for for weight loss and migraines.  She does state her migraines have been better and cannot contributed to anything different.  Last visit we discussed medication that would reduce her appetite however we agreed to 10 pound weight loss by lifestyle modifications diet and exercise.  Unfortunately patient was working night shift which got her off her circadian rhythm unable to exercise and eat properly because she had to rest during the day. Patient has No headache, No chest pain, No abdominal pain - No Nausea, No new weakness tingling or numbness, No Cough - shortness of breath  No problems updated.  No Known Allergies  Past Medical History:  Diagnosis Date   Gastritis     Current Outpatient Medications on File Prior to Visit  Medication Sig Dispense Refill   Etonogestrel (NEXPLANON [place]) Inject into the skin.     pantoprazole (PROTONIX) 20 MG tablet Take 1 tablet (20 mg total) by mouth daily. 30 tablet 2   sucralfate (CARAFATE) 1 g tablet Take 1 tablet (1 g total) by mouth 4 (four) times daily -  with meals and at bedtime. 120 tablet 1   No current facility-administered medications on file prior to visit.    Objective:   Vitals:   07/24/22 0913  BP: 110/75  Pulse: 81  Temp: 98.1 F (36.7 C)  TempSrc: Oral  SpO2: 93%  Weight: 237 lb 9.6 oz (107.8 kg)  Height: 5\' 3"  (1.6 m)    Exam General: No apparent distress. Eyes: Extraocular eye movements intact, pupils equal and round. Neck: Supple, trachea midline. Thyroid: No enlargement, mobile without fixation, no tenderness. Cardiovascular: Regular rhythm and rate, no murmur, normal  radial pulses. Respiratory: Normal respiratory effort, clear to auscultation. Gastrointestinal: Normal pitch active bowel sounds, nontender abdomen without distention or appreciable hepatomegaly. Musculoskeletal: Normal muscle tone, no tenderness on palpation of tibia, no excessive thoracic kyphosis. Skin: Appropriate warmth, no visible rash. Mental status: Alert, conversant, speech clear, thought logical, appropriate mood and affect, no hallucinations or delusions evident. Hematologic/lymphatic: No cervical adenopathy, no visible ecchymoses.  Data Review No results found for: "HGBA1C"  Assessment & Plan  Lindsay Yates was seen today for weight and migraine.  Diagnoses and all orders for this visit:  Morbid obesity (HCC) Morbid Obesity is BMI > 40  indicating an excess in caloric intake or underlining conditions. This may lead to other co-morbidities. Lifestyle modifications of diet and exercise may reduce obesity.   -     TSH + free T4 -     phentermine 37.5 MG capsule; Eat first than take 1 tablet in the morning  Lipid screening  Healthy lifestyle diet of fruits vegetables fish nuts whole grains and low saturated fat . Foods high in cholesterol or liver, fatty meats,cheese, butter avocados, nuts and seeds, chocolate and fried foods.  Low self esteem Self image due to her weight . Spoke with CSW and has a schedule appt.  -     phentermine 37.5 MG capsule; Eat first than take 1 tablet in the morning  Medication management -     Lipid Panel -  EKG 12-Lead  Patient have been counseled extensively about nutrition and exercise. Other issues discussed during this visit include: low cholesterol diet, weight control and daily exercise, foot care, annual eye examinations at Ophthalmology, importance of adherence with medications and regular follow-up. We also discussed long term complications of uncontrolled diabetes and hypertension.   Return in about 4 weeks (around 08/21/2022) for weight  management .  The patient was given clear instructions to go to ER or return to medical center if symptoms don't improve, worsen or new problems develop. The patient verbalized understanding. The patient was told to call to get lab results if they haven't heard anything in the next week.   This note has been created with Education officer, environmental. Any transcriptional errors are unintentional.   Grayce Sessions, NP 07/24/2022, 10:30 AM

## 2022-07-24 NOTE — Patient Instructions (Signed)
Phentermine Capsules or Tablets What is this medication? PHENTERMINE (FEN ter meen) promotes weight loss. It works by decreasing appetite. It is often used for a short period of time. Changes to diet and exercise are often combined with this medication. This medicine may be used for other purposes; ask your health care provider or pharmacist if you have questions. COMMON BRAND NAME(S): Adipex-P, Atti-Plex P, Atti-Plex P Spansule, Fastin, Lomaira, Pro-Fast, Pro-Fast HS, Pro-Fast SA, Tara-8 What should I tell my care team before I take this medication? They need to know if you have any of these conditions: Agitation or nervousness Diabetes Glaucoma Heart disease High blood pressure History of drug abuse or addiction History of stroke Kidney disease Lung disease called Primary Pulmonary Hypertension (PPH) Taken an MAOI like Carbex, Eldepryl, Marplan, Nardil, or Parnate in last 14 days Taking stimulant medications for attention disorders, weight loss, or to stay awake Thyroid disease An unusual or allergic reaction to phentermine, other medications, foods, dyes, or preservatives Pregnant or trying to get pregnant Breast-feeding How should I use this medication? Take this medication by mouth with a glass of water. Follow the directions on the prescription label. Take your medication at regular intervals. Do not take it more often than directed. Do not stop taking except on your care team's advice. Talk to your care team about the use of this medication in children. While this medication may be prescribed for children 17 years or older for selected conditions, precautions do apply. Overdosage: If you think you have taken too much of this medicine contact a poison control center or emergency room at once. NOTE: This medicine is only for you. Do not share this medicine with others. What if I miss a dose? If you miss a dose, take it as soon as you can. If it is almost time for your next dose, take  only that dose. Do not take double or extra doses. What may interact with this medication? Do not take this medication with any of the following: MAOIs like Carbex, Eldepryl, Marplan, Nardil, and Parnate This medication may also interact with the following: Alcohol Certain medications for depression, anxiety, or psychotic disorders Certain medications for high blood pressure Linezolid Medications for colds or breathing difficulties like pseudoephedrine or phenylephrine Medications for diabetes Sibutramine Stimulant medications for attention disorders, weight loss, or to stay awake This list may not describe all possible interactions. Give your health care provider a list of all the medicines, herbs, non-prescription drugs, or dietary supplements you use. Also tell them if you smoke, drink alcohol, or use illegal drugs. Some items may interact with your medicine. What should I watch for while using this medication? Visit your care team for regular checks on your progress. Do not stop taking except on your care team's advice. You may develop a severe reaction. Your care team will tell you how much medication to take. Do not take this medication close to bedtime. It may prevent you from sleeping. You may get drowsy or dizzy. Do not drive, use machinery, or do anything that needs mental alertness until you know how this medication affects you. Do not stand or sit up quickly, especially if you are an older patient. This reduces the risk of dizzy or fainting spells. Alcohol may increase dizziness and drowsiness. Avoid alcoholic drinks. This medication may affect blood sugar levels. Ask your care team if changes in diet or medications are needed if you have diabetes. Women should inform their care team if they wish to become   pregnant or think they might be pregnant. Losing weight while pregnant is not advised and may cause harm to the unborn child. Talk to your care team for more information. What side  effects may I notice from receiving this medication? Side effects that you should report to your care team as soon as possible: Allergic reactions--skin rash, itching, hives, swelling of the face, lips, tongue, or throat Heart valve disease--shortness of breath, chest pain, unusual weakness or fatigue, dizziness, feeling faint or lightheaded, fever, sudden weight gain, fast or irregular heartbeat Pulmonary hypertension--shortness of breath, chest pain, fast or irregular heartbeat, feeling faint or lightheaded, fatigue, swelling of the ankles or feet Side effects that usually do not require medical attention (report to your care team if they continue or are bothersome): Change in taste Diarrhea Dizziness Dry mouth Restlessness Trouble sleeping This list may not describe all possible side effects. Call your doctor for medical advice about side effects. You may report side effects to FDA at 1-800-FDA-1088. Where should I keep my medication? Keep out of the reach of children. This medication can be abused. Keep your medication in a safe place to protect it from theft. Do not share this medication with anyone. Selling or giving away this medication is dangerous and against the law. This medication may cause harm and death if it is taken by other adults, children, or pets. Return medication that has not been used to an official disposal site. Contact the DEA at 1-800-882-9539 or your city/county government to find a site. If you cannot return the medication, mix any unused medication with a substance like cat litter or coffee grounds. Then throw the medication away in a sealed container like a sealed bag or coffee can with a lid. Do not use the medication after the expiration date. Store at room temperature between 20 and 25 degrees C (68 and 77 degrees F). Keep container tightly closed. NOTE: This sheet is a summary. It may not cover all possible information. If you have questions about this medicine,  talk to your doctor, pharmacist, or health care provider.  2023 Elsevier/Gold Standard (2021-10-18 00:00:00)  

## 2022-07-25 LAB — LIPID PANEL
Chol/HDL Ratio: 4.9 ratio — ABNORMAL HIGH (ref 0.0–4.4)
Cholesterol, Total: 196 mg/dL (ref 100–199)
HDL: 40 mg/dL (ref >39)
LDL Chol Calc (NIH): 133 mg/dL — ABNORMAL HIGH (ref 0–99)
Triglycerides: 126 mg/dL (ref 0–149)
VLDL Cholesterol Cal: 23 mg/dL (ref 5–40)

## 2022-07-25 LAB — TSH+FREE T4
Free T4: 1.38 ng/dL (ref 0.82–1.77)
TSH: 1.89 u[IU]/mL (ref 0.450–4.500)

## 2022-07-30 NOTE — Telephone Encounter (Signed)
LCSWA met with patient in office on 07/24/22. Patient was a warm hand off from pcp. Patient is struggling with her self-esteem and weight lose journey. Patient stated that she recently lost her job because her FMLA was not approved due to having stomach issues. She has since started a new job but is upset about having to leave the previous one. Patient was advised to do morning walks, drink 3-4 bottles of water per day and to read affirmations. LCSWA also scheduled an appointment for patient to follow up and review/develop coping skills.

## 2022-08-01 ENCOUNTER — Telehealth (INDEPENDENT_AMBULATORY_CARE_PROVIDER_SITE_OTHER): Payer: Self-pay

## 2022-08-01 NOTE — Telephone Encounter (Signed)
-----   Message from Grayce Sessions, NP sent at 07/29/2022 10:05 PM EDT ----- Your cholesterol is high, LDL there is a Increase risk of heart attack and/or stroke.  To reduce your Cholesterol , Remember - more fruits and vegetables, more fish, and limit red meat and dairy products. More soy, nuts, beans, barley, lentils, oats and plant sterol ester enriched margarine instead of butter. I also encourage eliminating sugar and processed food.

## 2022-08-01 NOTE — Telephone Encounter (Signed)
Patient verified DOB. She is aware of normal labs except elevated cholesterol; increased risk for stroke and heart attack. Advised no medication at this time. Provided dietary instructions. Patient verbalized understanding. Maryjean Morn, CMA

## 2022-08-28 ENCOUNTER — Encounter (INDEPENDENT_AMBULATORY_CARE_PROVIDER_SITE_OTHER): Payer: Medicaid Other | Admitting: Licensed Clinical Social Worker

## 2022-08-28 ENCOUNTER — Encounter (INDEPENDENT_AMBULATORY_CARE_PROVIDER_SITE_OTHER): Payer: Self-pay | Admitting: Primary Care

## 2022-08-28 ENCOUNTER — Ambulatory Visit (INDEPENDENT_AMBULATORY_CARE_PROVIDER_SITE_OTHER): Payer: Medicaid Other | Admitting: Primary Care

## 2022-08-28 VITALS — BP 133/84 | HR 80 | Resp 16 | Wt 234.4 lb

## 2022-08-28 DIAGNOSIS — Z0130 Encounter for examination of blood pressure without abnormal findings: Secondary | ICD-10-CM | POA: Diagnosis not present

## 2022-08-28 DIAGNOSIS — E782 Mixed hyperlipidemia: Secondary | ICD-10-CM

## 2022-08-28 NOTE — Patient Instructions (Signed)
Preventing Hypertension Hypertension, also called high blood pressure, is when the force of blood pumping through the arteries is too strong. Arteries are blood vessels that carry blood from the heart throughout the body. Often, hypertension does not cause symptoms until blood pressure is very high. It is important to have your blood pressure checked regularly. Diet and lifestyle changes can help you prevent hypertension, and they may make you feel better overall and improve your quality of life. If you already have hypertension, you may control it with diet and lifestyle changes, as well as with medicine. How can this condition affect me? Over time, hypertension can damage the arteries and decrease blood flow to important parts of the body, including the brain, heart, and kidneys. By keeping your blood pressure in a healthy range, you can help prevent complications like heart attack, heart failure, stroke, kidney failure, and vascular dementia. What can increase my risk? An unhealthy diet and a lack of physical activity can make you more likely to develop high blood pressure. Some other risk factors include: Age. The risk increases with age. Having family members who have had high blood pressure. Having certain health conditions, such as thyroid problems. Being overweight or obese. Drinking too much alcohol or caffeine. Having too much fat, sugar, calories, or salt (sodium) in your diet. Smoking or using illegal drugs. Taking certain medicines, such as antidepressants, decongestants, birth control pills, and NSAIDs, such as ibuprofen. What actions can I take to prevent or manage this condition? Work with your health care provider to make a hypertension prevention plan that works for you. You may be referred for counseling on a healthy diet and physical activity. Follow your plan and keep all follow-up visits. Diet changes Maintain a healthy diet. This includes: Eating less salt (sodium). Ask your  health care provider how much sodium is safe for you to have. The general recommendation is to have less than 1 tsp (2,300 mg) of sodium a day. Do not add salt to your food. Choose low-sodium options when grocery shopping and eating out. Limiting fats in your diet. You can do this by eating low-fat or fat-free dairy products and by eating less red meat. Eating more fruits, vegetables, and whole grains. Make a goal to eat: 1-2 cups of fresh fruits and vegetables each day. 3-4 servings of whole grains each day. Avoiding foods and beverages that have added sugars. Eating fish that contain healthy fats (omega-3 fatty acids), such as mackerel or salmon. If you need help putting together a healthy eating plan, try the DASH diet. This diet is high in fruits, vegetables, and whole grains. It is low in sodium, red meat, and added sugars. DASH stands for Dietary Approaches to Stop Hypertension. Lifestyle changes  Lose weight if you are overweight. Losing just 3-5% of your body weight can help prevent or control hypertension. For example, if your present weight is 200 lb (91 kg), a loss of 3-5% of your weight means losing 6-10 lb (2.7-4.5 kg). Ask your health care provider to help you with a diet and exercise plan to safely lose weight. Get enough exercise. Do at least 150 minutes of moderate-intensity exercise each week. You could do this in short exercise sessions several times a day, or you could do longer exercise sessions a few times a week. For example, you could take a brisk 10-minute walk or bike ride, 3 times a day, for 5 days a week. Find ways to reduce stress, such as exercising, meditating, listening to   music, or taking a yoga class. If you need help reducing stress, ask your health care provider. Do not use any products that contain nicotine or tobacco. These products include cigarettes, chewing tobacco, and vaping devices, such as e-cigarettes. Chemicals in tobacco and nicotine products raise your  blood pressure each time you use them. If you need help quitting, ask your health care provider. Learn how to check your blood pressure at home. Make sure that you know your personal target blood pressure, as told by your health care provider. Try to sleep 7-9 hours per night. Alcohol use Do not drink alcohol if: Your health care provider tells you not to drink. You are pregnant, may be pregnant, or are planning to become pregnant. If you drink alcohol: Limit how much you have to: 0-1 drink a day for women. 0-2 drinks a day for men. Know how much alcohol is in your drink. In the [place] one drink equals one 12 oz bottle of beer (355 mL), one 5 oz glass of wine (148 mL), or one 1 oz glass of hard liquor (44 mL). Medicines In addition to diet and lifestyle changes, your health care provider may recommend medicines to help lower your blood pressure. In general: You may need to try a few different medicines to find what works best for you. You may need to take more than one medicine. Take over-the-counter and prescription medicines only as told by your health care provider. Questions to ask your health care provider What is my blood pressure goal? How can I lower my risk for high blood pressure? How should I monitor my blood pressure at home? Where to find support Your health care provider can help you prevent hypertension and help you keep your blood pressure at a healthy level. Your local hospital or your community may also provide support services and prevention programs. The American Heart Association offers an online support network at supportnetwork.heart.org Where to find more information Learn more about hypertension from: National Heart, Lung, and Blood Institute: www.nhlbi.nih.gov Centers for Disease Control and Prevention: www.cdc.gov American Academy of Family Physicians: familydoctor.org Learn more about the DASH diet from: National Heart, Lung, and Blood Institute:  www.nhlbi.nih.gov Contact a health care provider if: You think you are having a reaction to medicines you have taken. You have recurrent headaches or feel dizzy. You have swelling in your ankles. You have trouble with your vision. Get help right away if: You have sudden, severe chest, back, or abdominal pain or discomfort. You have shortness of breath. You have a sudden, severe headache. These symptoms may be an emergency. Get help right away. Call 911. Do not wait to see if the symptoms will go away. Do not drive yourself to the hospital. Summary Hypertension often does not cause any symptoms until blood pressure is very high. It is important to get your blood pressure checked regularly. Diet and lifestyle changes are important steps in preventing hypertension. By keeping your blood pressure in a healthy range, you may prevent complications like heart attack, heart failure, stroke, and kidney failure. Work with your health care provider to make a hypertension prevention plan that works for you. This information is not intended to replace advice given to you by your health care provider. Make sure you discuss any questions you have with your health care provider. Document Revised: 09/05/2021 Document Reviewed: 09/05/2021 Elsevier Patient Education  2023 Elsevier Inc.  

## 2022-08-29 LAB — LIPID PANEL
Chol/HDL Ratio: 4.3 ratio (ref 0.0–4.4)
Cholesterol, Total: 186 mg/dL (ref 100–199)
HDL: 43 mg/dL (ref >39)
LDL Chol Calc (NIH): 117 mg/dL — ABNORMAL HIGH (ref 0–99)
Triglycerides: 143 mg/dL (ref 0–149)
VLDL Cholesterol Cal: 26 mg/dL (ref 5–40)

## 2022-08-29 NOTE — Progress Notes (Signed)
Lindsay Yates  Lindsay Yates, is a 21 y.o. female  GUR:[id number]  EGB:[id number]  DOB - 2001-03-11  Chief Complaint  Patient presents with   Weight Management Screening       Subjective:   Lindsay Yates is a 21 y.o. female here today for a follow up visit weight loss. resents for a follow up after starting on phentermine, for weight loss for 1 months. Patient states they have better variety, less frequent dining out, and more consistent meal timing. While on the phentermine, they have lost 3 lbs since last visit. They deny palpitations, anxiety, trouble sleeping, elevated BP. Patient has No headache, No chest pain, No abdominal pain - No Nausea, No new weakness tingling or numbness, No Cough - shortness of breath.  No problems updated.  No Known Allergies  Past Medical History:  Diagnosis Date   Gastritis     Current Outpatient Medications on File Prior to Visit  Medication Sig Dispense Refill   Etonogestrel (NEXPLANON [place]) Inject into the skin.     pantoprazole (PROTONIX) 20 MG tablet Take 1 tablet (20 mg total) by mouth daily. 30 tablet 2   phentermine 37.5 MG capsule Eat first than take 1 tablet in the morning 30 capsule 1   sucralfate (CARAFATE) 1 g tablet Take 1 tablet (1 g total) by mouth 4 (four) times daily -  with meals and at bedtime. 120 tablet 1   No current facility-administered medications on file prior to visit.    Objective:   Vitals:   08/28/22 1144  BP: 133/84  Pulse: 80  Resp: 16  SpO2: 97%  Weight: 234 lb 6.4 oz (106.3 kg)    Exam General appearance : Awake, alert, not in any distress. Speech Clear. Not toxic looking HEENT: Atraumatic and Normocephalic, pupils equally reactive to light and accomodation Neck: Supple, no JVD. No cervical lymphadenopathy.  Chest: Good air entry bilaterally, no added sounds  CVS: S1 S2 regular, no murmurs.  Abdomen: Bowel sounds present, Non tender and not distended with no gaurding,  rigidity or rebound. Extremities: B/L Lower Ext shows no edema, both legs are warm to touch Neurology: Awake alert, and oriented X 3, CN II-XII intact, Non focal Skin: No Rash  Data Review No results found for: "HGBA1C"  Assessment & Plan   1. Mixed hyperlipidemia  Healthy lifestyle diet of fruits vegetables fish nuts whole grains and low saturated fat . Foods high in cholesterol or liver, fatty meats,cheese, butter avocados, nuts and seeds, chocolate and fried foods. - Lipid panel  2.Morbid obesity ([place]) She is currently on phentermine for weight loss.  Will continue medication.  Encourage healthy selections of and increased exercise  3. Blood pressure check Blood pressure today was 133/84 discussed only 22, morbid obesity increased risk for heart attack and stroke.  Counseled on eliminating fast food restaurants, sodium/salt in diet, reviewed how to read labels on foods to determine the sodium content.  Also she is aware, if her blood pressure is not controlled phentermine needs to be discontinued due to potentially elevating blood pressure.    Patient have been counseled extensively about nutrition and exercise. Other issues discussed during this visit include: low cholesterol diet, weight control and daily exercise, foot care, annual eye examinations at Ophthalmology, importance of adherence with medications and regular follow-up. We also discussed long term complications of uncontrolled diabetes and hypertension.   Return in about 4 weeks (around 09/25/2022) for Bp ck .  The patient was given clear instructions to  go to ER or return to medical center if symptoms don't improve, worsen or new problems develop. The patient verbalized understanding. The patient was told to call to get lab results if they haven't heard anything in the next week.   This note has been created with Surveyor, quantity. Any transcriptional errors are unintentional.    Kerin Perna, NP 08/29/2022, 10:15 PM

## 2022-09-03 ENCOUNTER — Telehealth (INDEPENDENT_AMBULATORY_CARE_PROVIDER_SITE_OTHER): Payer: Self-pay

## 2022-09-03 NOTE — Telephone Encounter (Signed)
Contacted pt to go over lab results pt is aware and doesn't have any questions or concerns 

## 2022-09-25 ENCOUNTER — Ambulatory Visit (INDEPENDENT_AMBULATORY_CARE_PROVIDER_SITE_OTHER): Payer: Medicaid Other | Admitting: Primary Care

## 2022-10-13 ENCOUNTER — Ambulatory Visit (INDEPENDENT_AMBULATORY_CARE_PROVIDER_SITE_OTHER): Payer: Medicaid Other

## 2022-10-13 VITALS — BP 104/72

## 2022-10-13 DIAGNOSIS — Z0130 Encounter for examination of blood pressure without abnormal findings: Secondary | ICD-10-CM

## 2022-10-13 NOTE — Telephone Encounter (Signed)
Completed.

## 2023-05-07 ENCOUNTER — Ambulatory Visit (HOSPITAL_COMMUNITY)
Admission: EM | Admit: 2023-05-07 | Discharge: 2023-05-07 | Disposition: A | Discharge status: Home / Self Care | Payer: Medicaid Other | Attending: Nurse Practitioner | Admitting: Nurse Practitioner

## 2023-05-07 ENCOUNTER — Encounter (HOSPITAL_COMMUNITY): Payer: Self-pay | Admitting: *Deleted

## 2023-05-07 DIAGNOSIS — G5603 Carpal tunnel syndrome, bilateral upper limbs: Secondary | ICD-10-CM

## 2023-05-07 MED ORDER — METHYLPREDNISOLONE 4 MG PO TBPK: ORAL_TABLET | ORAL | 0 refills | Status: AC

## 2023-05-07 NOTE — ED Provider Notes (Signed)
[facility]    CSN: [id number] Arrival date & time: 05/07/23  0945      History   Chief Complaint Chief Complaint  Patient presents with   Numbness    HPI Lindsay Yates is a 22 y.o. female.   HPI  She is in today for evaluation of numbness of bilateral upper extremities.  She reports that she has developed numbness into her right on going down into her wrist and affecting all of her fingers.  This morning when she woke up she was having some left hand numbness 3 of her fingers index and middle.  She endorses that she works at [employer]" she is doing repetitive movements throughout the day.  She has been working double shifts.  She reports that she is taking ibuprofen which was not effective.  She denies any previous injury to neck or shoulder.  She denies any shortness of breath or chest pains.  She is just concerned while this is happening.  Past Medical History:  Diagnosis Date   Gastritis     There are no problems to display for this patient.   History reviewed. No pertinent surgical history.  OB History   No obstetric history on file.      Home Medications    Prior to Admission medications   Medication Sig Start Date End Date Taking? Authorizing Provider  methylPREDNISolone (MEDROL) 4 MG TBPK tablet Follow package instructions. 05/07/23 05/13/23 Yes Zenab Gronewold, Shana Chute, NP  Etonogestrel (NEXPLANON [place]) Inject into the skin.    [provider]    Family History History reviewed. No pertinent family history.  Social History Social History   Tobacco Use   Smoking status: Never   Smokeless tobacco: Never  Vaping Use   Vaping Use: Never used  Substance Use Topics   Alcohol use: Not Currently   Drug use: Not Currently     Allergies   Patient has no known allergies.   Review of Systems Review of Systems   Physical Exam Triage Vital Signs ED Triage Vitals  Enc Vitals Group     BP 05/07/23 1022 130/76     Pulse Rate  05/07/23 1022 87     Resp 05/07/23 1022 18     Temp 05/07/23 1022 97.9 F (36.6 C)     Temp Source 05/07/23 1022 Oral     SpO2 05/07/23 1022 97 %     Weight --      Height --      Head Circumference --      Peak Flow --      Pain Score 05/07/23 1021 0     Pain Loc --      Pain Edu? --      Excl. in GC? --    No data found.  Updated Vital Signs BP 130/76 (BP Location: Right Arm)   Pulse 87   Temp 97.9 F (36.6 C) (Oral)   Resp 18   LMP 04/06/2023 (Approximate)   SpO2 97%   Visual Acuity Right Eye Distance:   Left Eye Distance:   Bilateral Distance:    Right Eye Near:   Left Eye Near:    Bilateral Near:     Physical Exam Constitutional:      Appearance: She is obese.  HENT:     Head: Normocephalic.     Nose: Nose normal.     Mouth/Throat:     Mouth: Mucous membranes are moist.  Eyes:     Pupils: Pupils  are equal, round, and reactive to light.  Cardiovascular:     Rate and Rhythm: Normal rate.  Pulmonary:     Effort: Pulmonary effort is normal.  Musculoskeletal:     Right shoulder: Crepitus present.     Left shoulder: Normal.     Right forearm: Swelling and tenderness present.     Right wrist: Tenderness present.     Right hand: Decreased strength.     Cervical back: Normal range of motion. No tenderness.     Comments: Phalen positive BL after 30 secs   Neurological:     Mental Status: She is alert.      UC Treatments / Results  Labs (all labs ordered are listed, but only abnormal results are displayed) Labs Reviewed - No data to display  EKG   Radiology No results found.  Procedures Procedures (including critical care time)  Medications Ordered in UC Medications - No data to display  Initial Impression / Assessment and Plan / UC Course  I have reviewed the triage vital signs and the nursing notes.  Pertinent labs & imaging results that were available during my care of the patient were reviewed by me and considered in my medical decision  making (see chart for details).     numbness Final Clinical Impressions(s) / UC Diagnoses   Final diagnoses:  Bilateral carpal tunnel syndrome     Discharge Instructions      You have been diagnosed with carpal tunnel syndrome bilaterally right being greater than the left.  The recommendation is for you to take the steroid which will help decrease the swelling that you are experiencing in the right forearm.  You are also encouraged to wear the braces especially at night and possibly during the day while working.  You can also use Salonpas patches to be applied to the area that are painful.  To be careful sometimes the patches can irritate your skin so make sure that she rotate them as recommended.  If your symptoms persist or get worse can follow-up.  Follow-up with your primary care for referral to orthopedics.     ED Prescriptions     Medication Sig Dispense Auth. Provider   methylPREDNISolone (MEDROL) 4 MG TBPK tablet Follow package instructions. 21 tablet Barbette Merino, NP      PDMP not reviewed this encounter.   Thad Ranger [place], [place] 05/07/23 [phone]

## 2023-05-07 NOTE — ED Triage Notes (Signed)
Pt states for last 1.5 weeks she has woke up in the morning with her right arm numb but this morning she woke up with her left hand numb in addition to her right arm. She did take IBU last night.

## 2023-05-07 NOTE — Discharge Instructions (Addendum)
You have been diagnosed with carpal tunnel syndrome bilaterally right being greater than the left.  The recommendation is for you to take the steroid which will help decrease the swelling that you are experiencing in the right forearm.  You are also encouraged to wear the braces especially at night and possibly during the day while working.  You can also use Salonpas patches to be applied to the area that are painful.  To be careful sometimes the patches can irritate your skin so make sure that she rotate them as recommended.  If your symptoms persist or get worse can follow-up.  Follow-up with your primary care for referral to orthopedics.

## 2023-05-07 NOTE — ED Notes (Signed)
Pt states she only would like the right brace

## 2023-10-21 ENCOUNTER — Telehealth (INDEPENDENT_AMBULATORY_CARE_PROVIDER_SITE_OTHER): Payer: Self-pay

## 2023-10-21 NOTE — Telephone Encounter (Signed)
Called pt to inform them about copay or what the pt wanted to come in and pay. Advised pt that they could pay 98.80 upon visit.

## 2023-10-21 NOTE — Telephone Encounter (Signed)
Copied from CRM [id number]. Topic: General - Inquiry >> Oct 21, 2023  8:59 AM Lennox Pippins wrote: Patient wants a call back to know how much her visit will be on 10/28/23 with Gwinda Passe. Patient is self pay now and wants to make sure she brings the correct amount for a copay to the visit. Advised patient of the 62% discount plus a possible $98 copay at the time of the visit. Please folow back up with the patient @ Phone # [phone]

## 2023-10-27 ENCOUNTER — Telehealth (INDEPENDENT_AMBULATORY_CARE_PROVIDER_SITE_OTHER): Payer: Self-pay | Admitting: Primary Care

## 2023-10-27 NOTE — Telephone Encounter (Signed)
Called to confirm apt. VM left.

## 2023-10-28 ENCOUNTER — Telehealth (INDEPENDENT_AMBULATORY_CARE_PROVIDER_SITE_OTHER): Payer: Self-pay | Admitting: Primary Care

## 2023-10-28 ENCOUNTER — Ambulatory Visit (INDEPENDENT_AMBULATORY_CARE_PROVIDER_SITE_OTHER): Payer: Self-pay | Admitting: Primary Care

## 2023-10-28 NOTE — Telephone Encounter (Signed)
Called pt to see if they wanted to come in at a sooner time

## 2024-01-18 IMAGING — US US ABDOMEN LIMITED
1 series · 14 of 25 positions shown · non-contrast
Comparison: None.

CLINICAL DATA: RUQ pain

EXAM:
ULTRASOUND ABDOMEN LIMITED RIGHT UPPER QUADRANT

[Series 1: us abdomen limited · 14 of 29 slices shown]
[im 1/29]
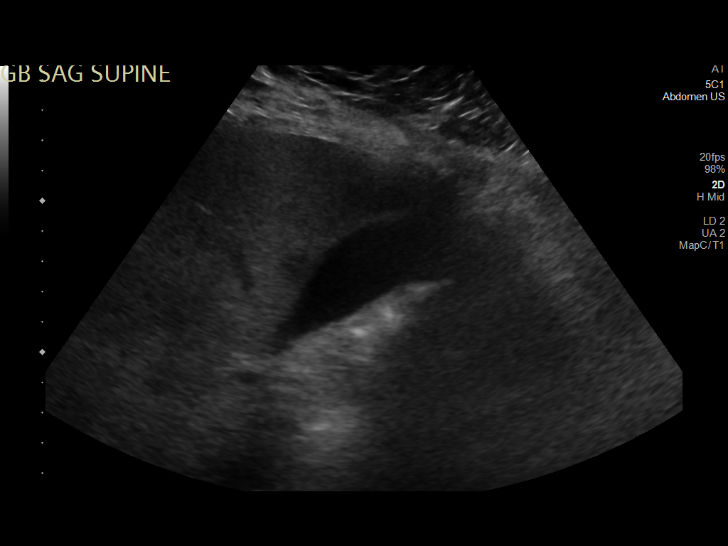
[im 3/29]
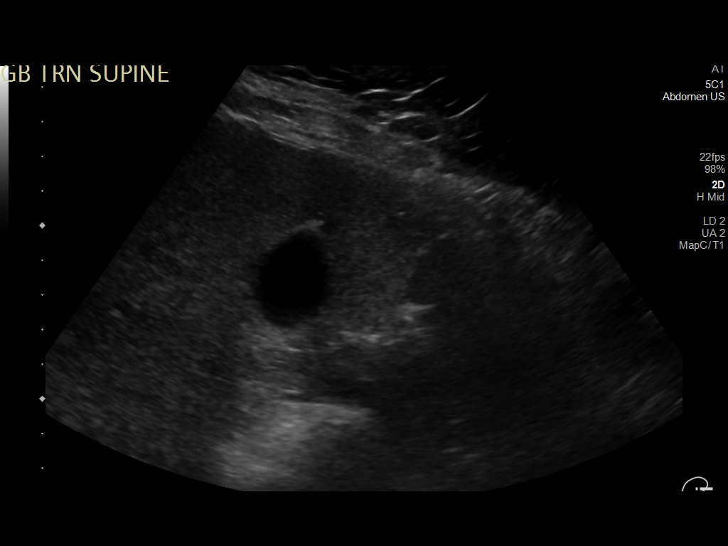
[im 5/29]
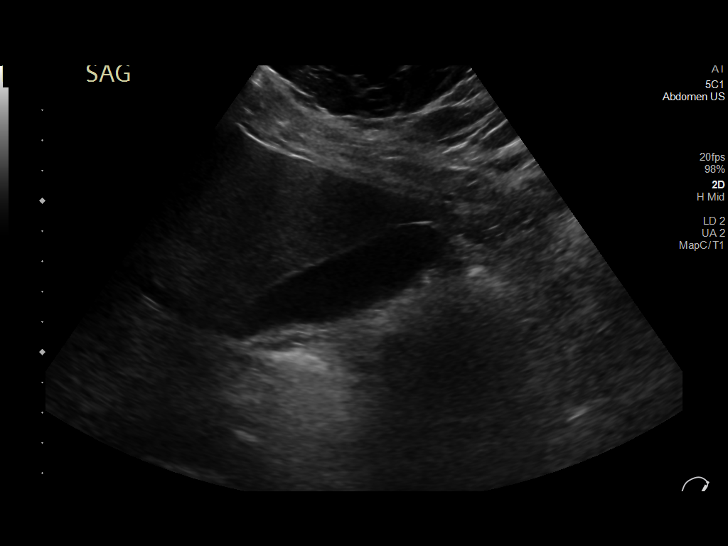
[im 8/29]
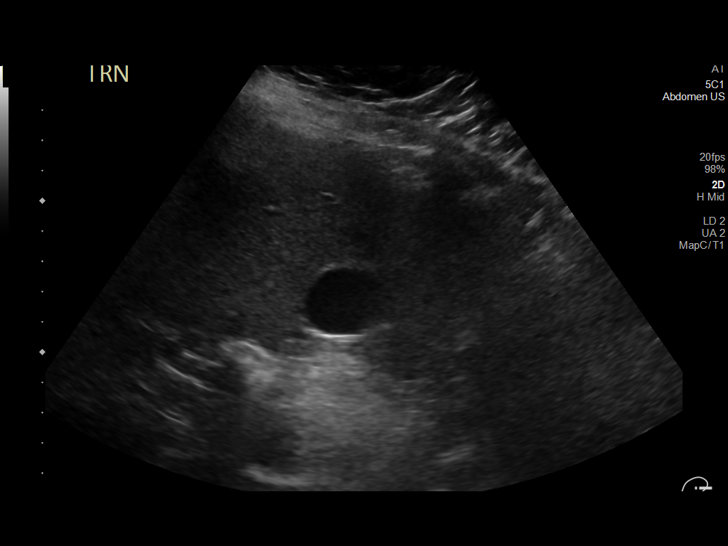
[im 10/29]
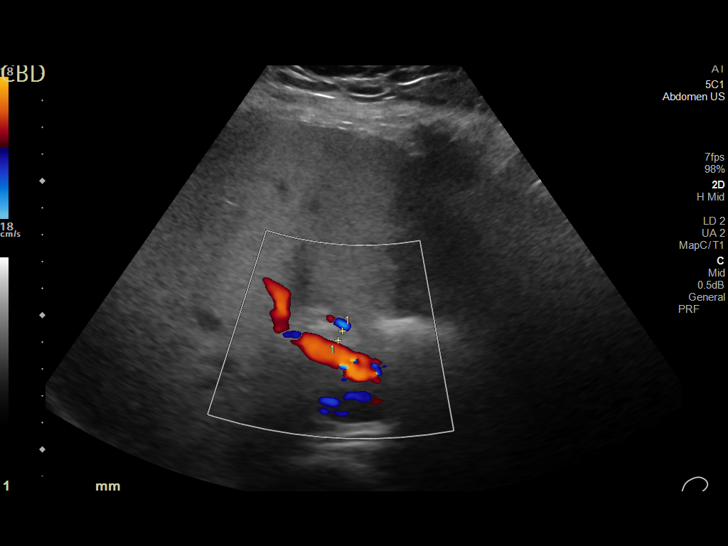
[im 11/29]
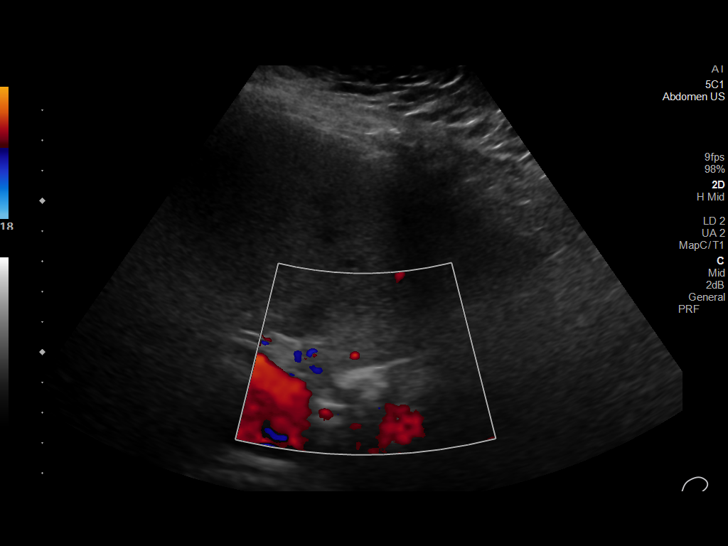
[im 13/29]
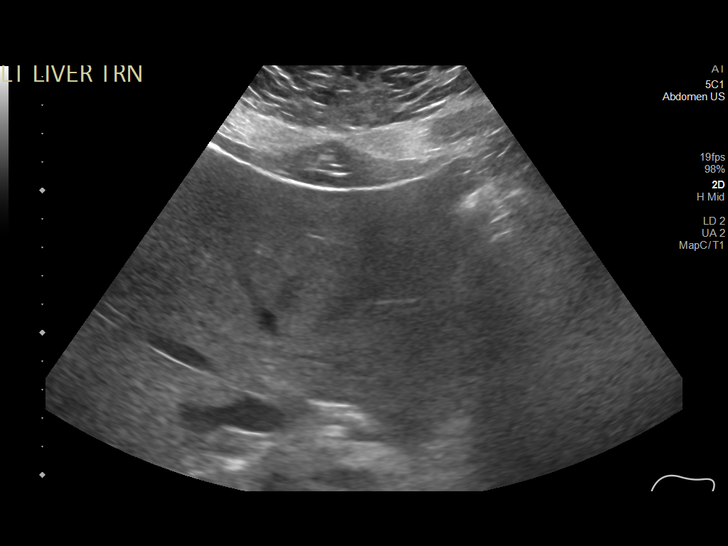
[im 16/29]
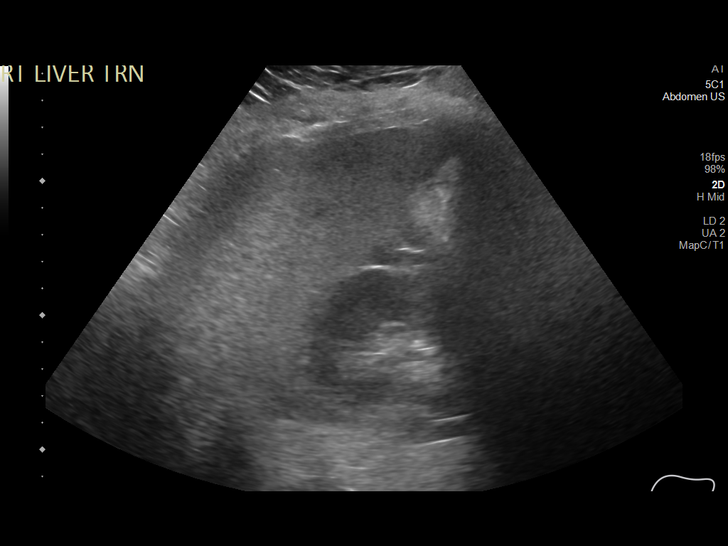
[im 18/29]
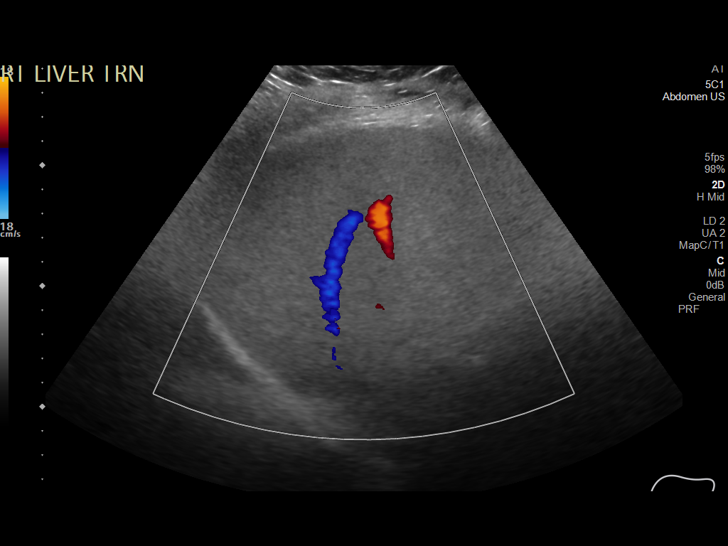
[im 19/29]
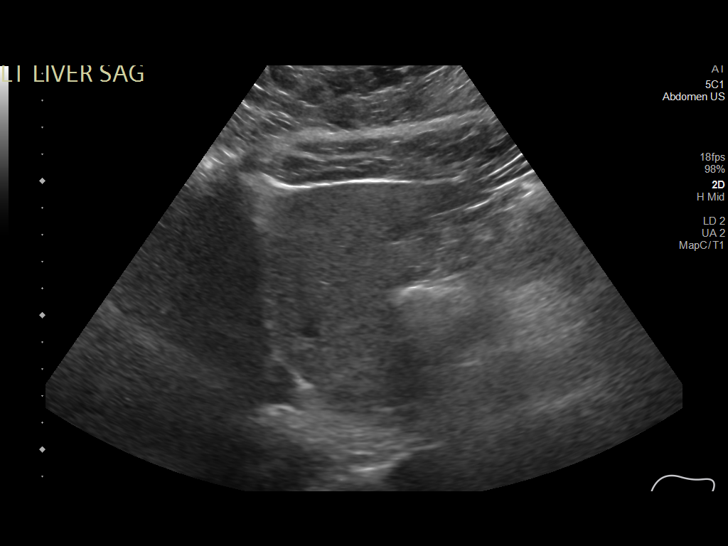
[im 22/29]
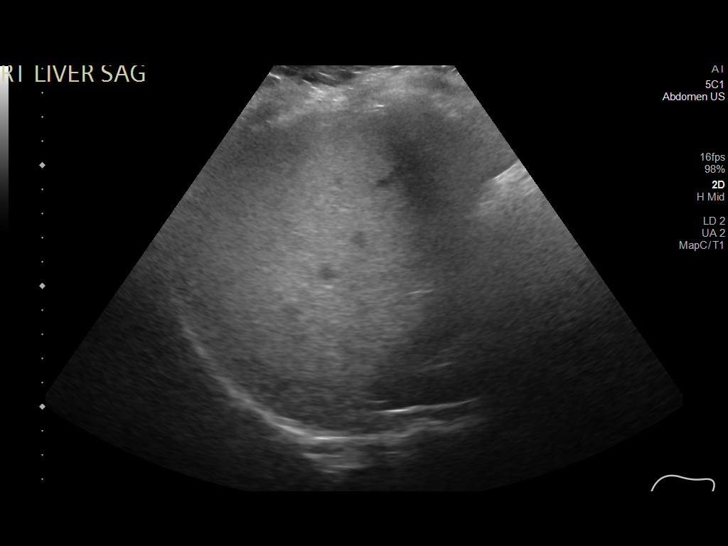
[im 24/29]
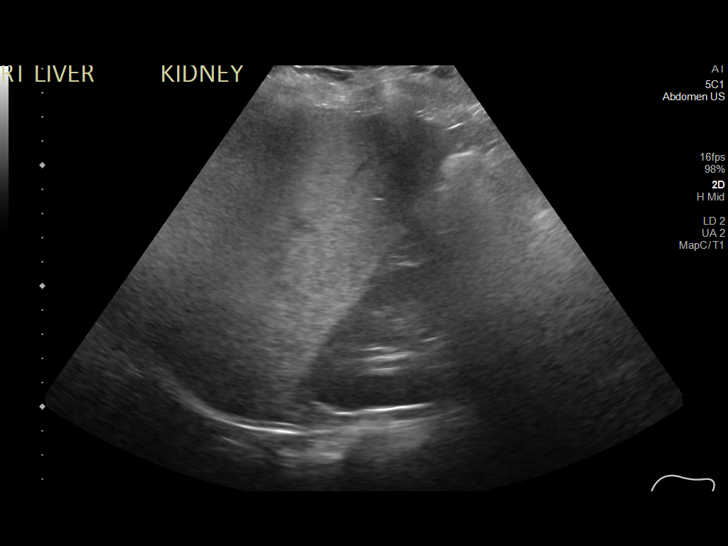
[im 26/29]
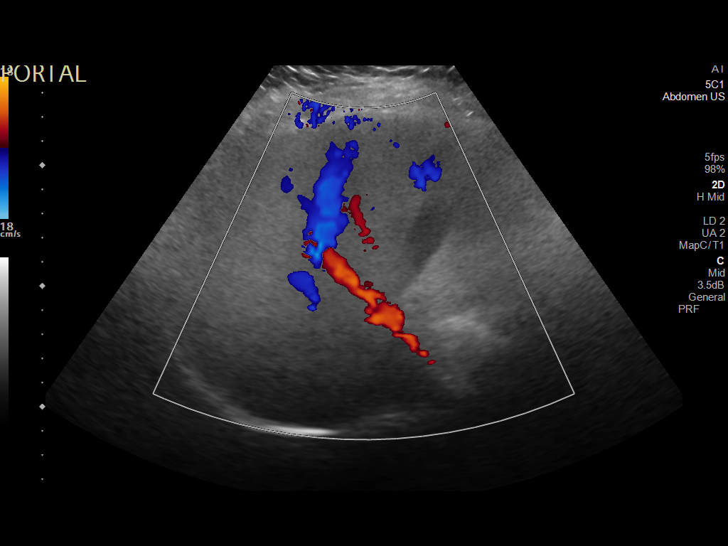
[im 29/29]
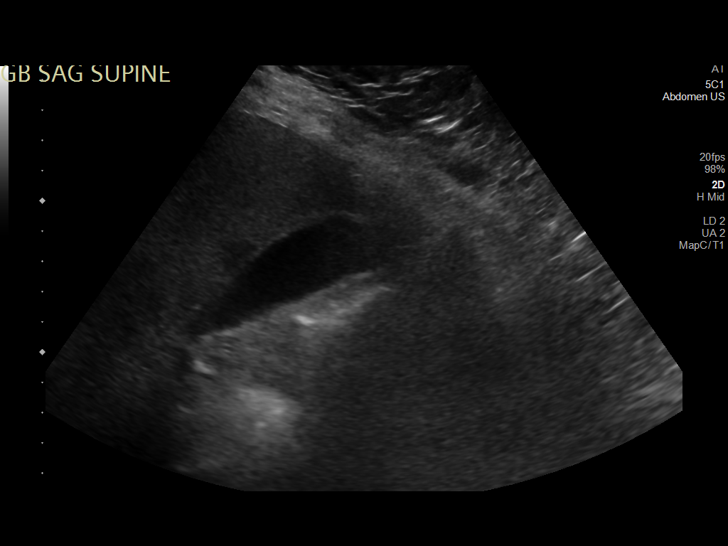

[14 of 25 positions shown; findings below may reference images not displayed]

FINDINGS: Gallbladder:

No gallstones or wall thickening visualized. No sonographic Murphy
sign noted by sonographer.

Common bile duct:

Diameter: 6 mm.

Liver:

Focal fatty sparing along the gallbladder fossa (image 6 of 30). No
definite focal lesion identified. Increased parenchymal
echogenicity. Portal vein is patent on color Doppler imaging with
normal direction of blood flow towards the liver.

Other: None.
IMPRESSION: Hepatic steatosis. Please note limited evaluation for focal hepatic
masses in a patient with hepatic steatosis due to decreased
penetration of the acoustic ultrasound waves.

## 2024-01-24 IMAGING — CT CT ABD-PELV W/ CM
2 of 4 series · 17 of 46 positions shown, 19 images · IV contrast (APPLIED)
Comparison: None.

CLINICAL DATA: Abdominal pain, significantly worse with eating or
drinking.

EXAM:
CT ABDOMEN AND PELVIS WITH CONTRAST
TECHNIQUE: Multidetector CT imaging of the abdomen and pelvis was performed
using the standard protocol following bolus administration of
intravenous contrast.

[Series 3: abdomen 5.0 · axial · 0.94mm/px · z∈[+795,+1225]mm · 14 of 98 slices shown, 16 images]
[im 6/98  soft-tissue]
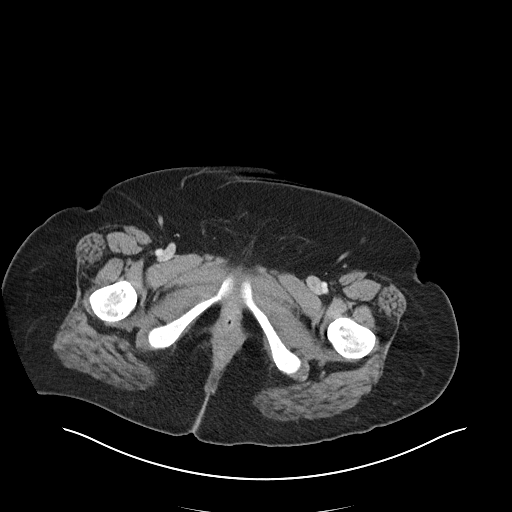
[im 6/98  bone]
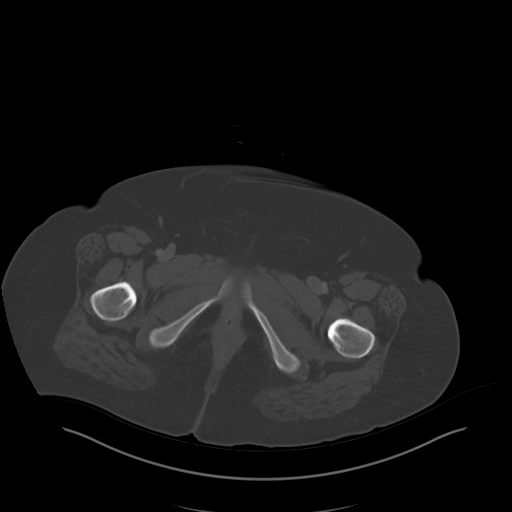
[im 12/98  soft-tissue]
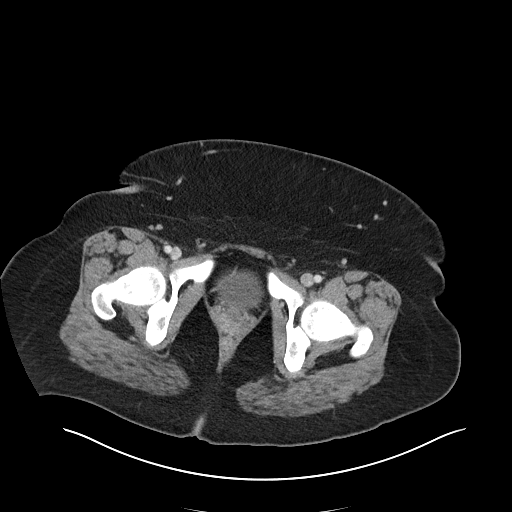
[im 18/98  soft-tissue]
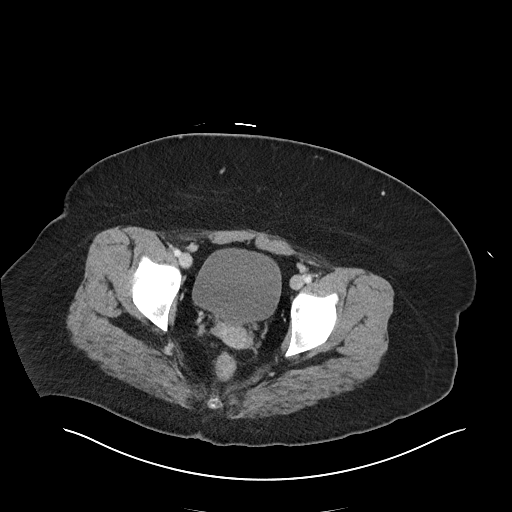
[im 29/98  soft-tissue]
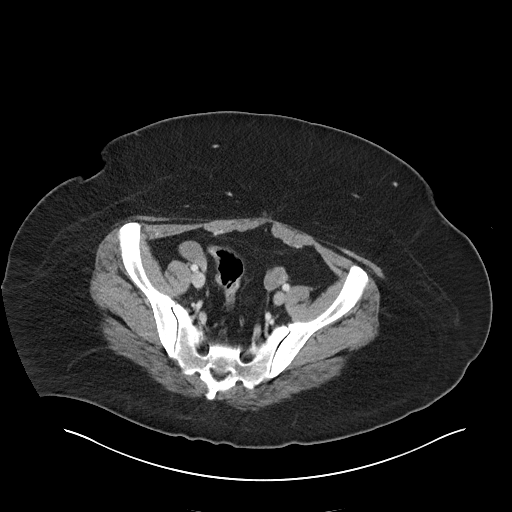
[im 35/98  soft-tissue]
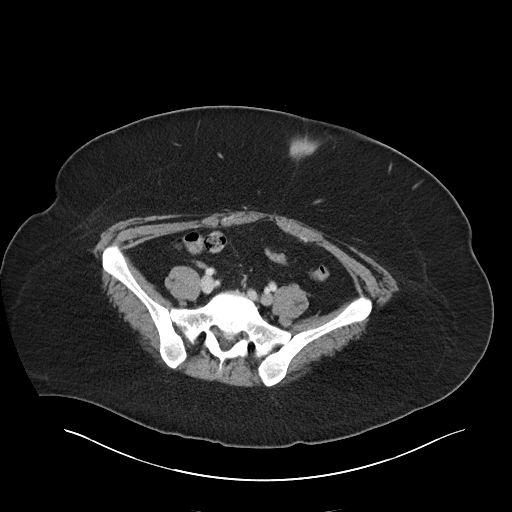
[im 40/98  soft-tissue]
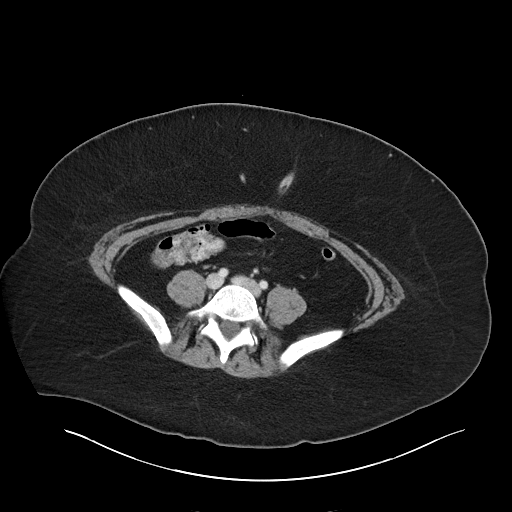
[im 46/98  soft-tissue]
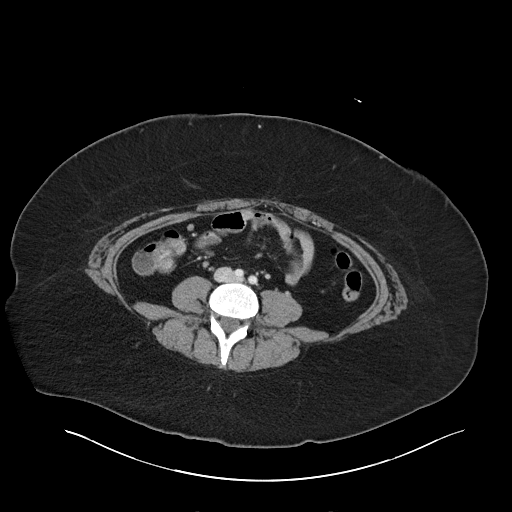
[im 52/98  soft-tissue]
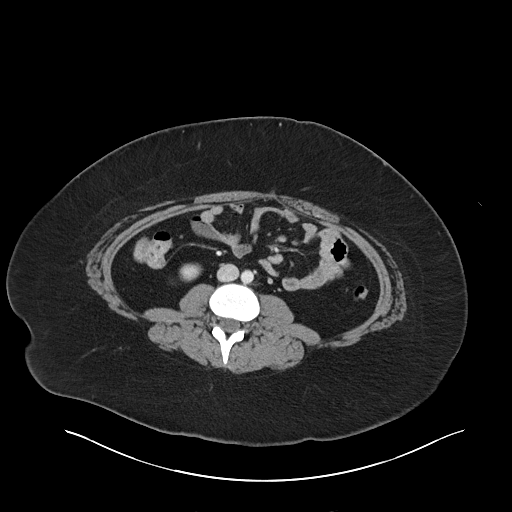
[im 58/98  soft-tissue]
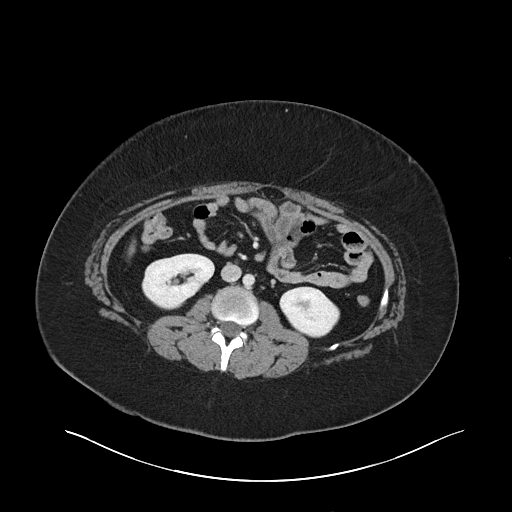
[im 58/98  bone]
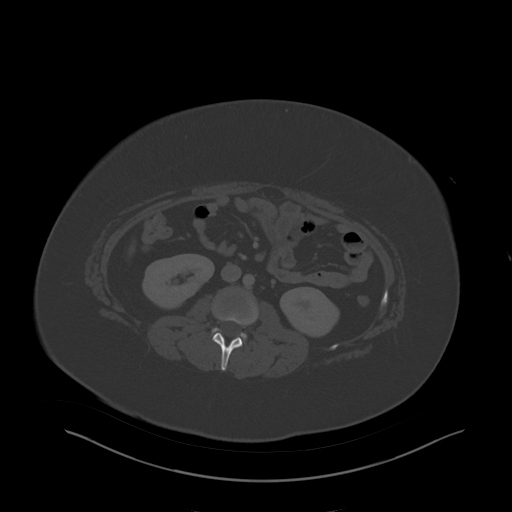
[im 63/98  soft-tissue]
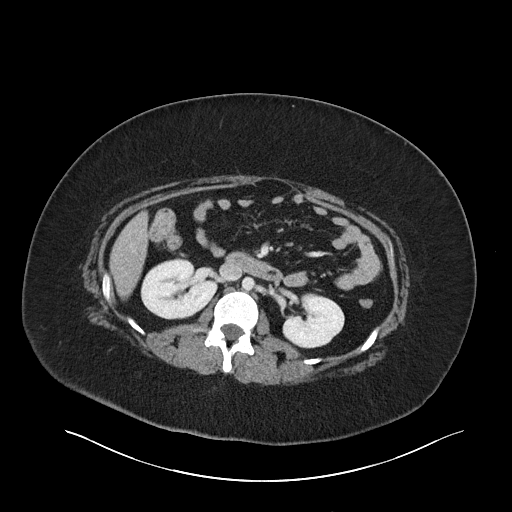
[im 75/98  soft-tissue]
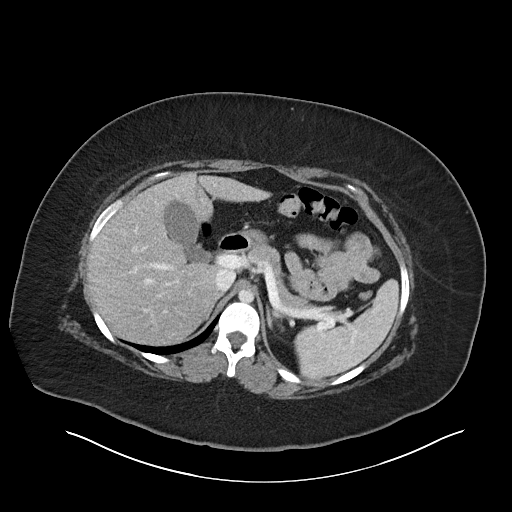
[im 80/98  soft-tissue]
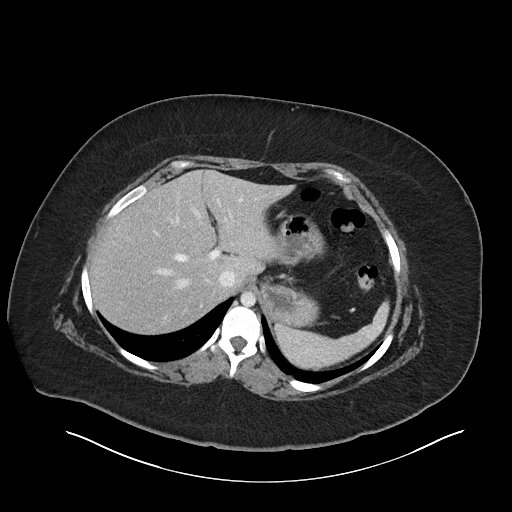
[im 86/98  soft-tissue]
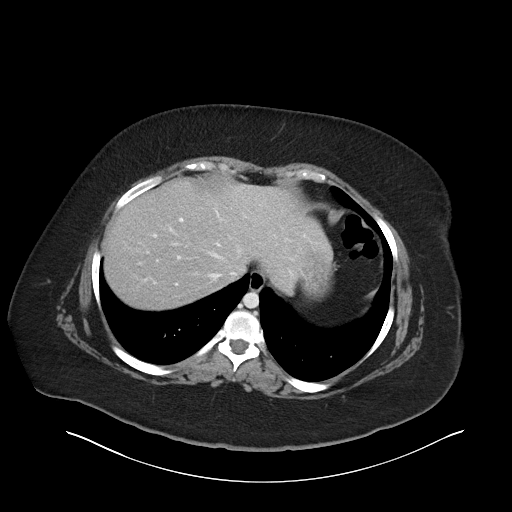
[im 92/98  soft-tissue]
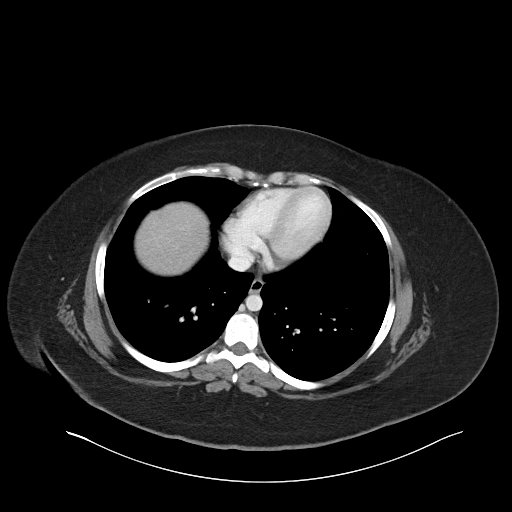

[Series 6: abdomen 3.0 mpr cor · coronal · 0.95mm/px · 3 of 115 slices shown]
[im 39/115  soft-tissue]
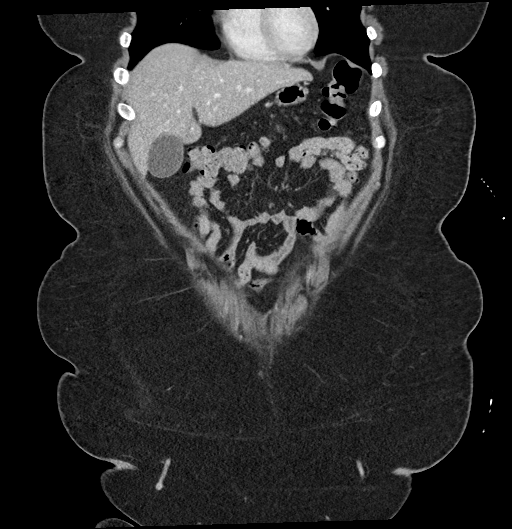
[im 51/115  soft-tissue]
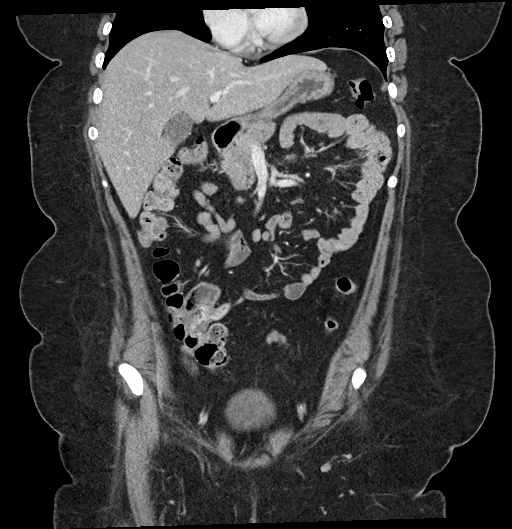
[im 64/115  soft-tissue]
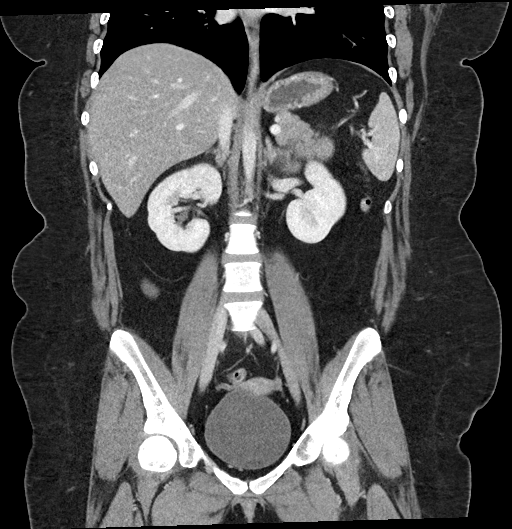

[17 of 46 positions shown; findings below may reference images not displayed]

RADIATION DOSE REDUCTION: This exam was performed according to the
departmental dose-optimization program which includes automated
exposure control, adjustment of the mA and/or kV according to
patient size and/or use of iterative reconstruction technique.

CONTRAST:  100mL OMNIPAQUE IOHEXOL 300 MG/ML  SOLN
FINDINGS: Lower chest: No acute abnormality.

Hepatobiliary: No focal liver abnormality is seen. No gallstones,
gallbladder wall thickening, or biliary dilatation.

Pancreas: Unremarkable. No pancreatic ductal dilatation or
surrounding inflammatory changes.

Spleen: Normal in size without focal abnormality.

Adrenals/Urinary Tract: Normal adrenal glands. No kidney mass or
hydronephrosis identified. Urinary bladder appears normal.

Stomach/Bowel: Stomach appears nondistended. The appendix is
visualized and is within normal limits. No bowel wall thickening,
inflammation, or distension.

Vascular/Lymphatic: Normal abdominal aorta. No abdominopelvic
adenopathy identified.

Reproductive: Uterus and adnexal structures appear within normal
limits.

Other: No free fluid or fluid collections.

Musculoskeletal: No acute or significant osseous findings.
IMPRESSION: No acute findings within the abdomen or pelvis.
